# Patient Record
Sex: Male | Born: 1970 | Race: Black or African American | Hispanic: No | Marital: Married | Smoking: Current every day smoker
Health system: Southern US, Community
[De-identification: ages and names within clinical notes are randomized; demographics above are authoritative.]

## PROBLEM LIST (undated history)

## (undated) DIAGNOSIS — I10 Essential (primary) hypertension: Secondary | ICD-10-CM

## (undated) DIAGNOSIS — Z21 Asymptomatic human immunodeficiency virus [HIV] infection status: Secondary | ICD-10-CM

## (undated) DIAGNOSIS — F32A Depression, unspecified: Secondary | ICD-10-CM

## (undated) DIAGNOSIS — R569 Unspecified convulsions: Secondary | ICD-10-CM

## (undated) DIAGNOSIS — F329 Major depressive disorder, single episode, unspecified: Secondary | ICD-10-CM

## (undated) DIAGNOSIS — B20 Human immunodeficiency virus [HIV] disease: Secondary | ICD-10-CM

## (undated) HISTORY — PX: NO PAST SURGERIES: SHX2092

## (undated) HISTORY — PX: OTHER SURGICAL HISTORY: SHX169

---

## 2005-04-19 ENCOUNTER — Emergency Department: Payer: Self-pay | Admitting: Emergency Medicine

## 2005-09-24 ENCOUNTER — Emergency Department: Payer: Self-pay | Admitting: Unknown Physician Specialty

## 2006-07-05 ENCOUNTER — Emergency Department: Payer: Self-pay

## 2007-02-26 ENCOUNTER — Emergency Department: Payer: Self-pay | Admitting: Emergency Medicine

## 2010-07-06 ENCOUNTER — Inpatient Hospital Stay: Payer: Self-pay | Admitting: Psychiatry

## 2010-07-07 ENCOUNTER — Emergency Department: Payer: Self-pay | Admitting: Internal Medicine

## 2015-05-24 ENCOUNTER — Emergency Department
Admission: EM | Admit: 2015-05-24 | Discharge: 2015-05-24 | Disposition: A | Discharge status: Home / Self Care | Payer: Self-pay | Attending: Emergency Medicine | Admitting: Emergency Medicine

## 2015-05-24 ENCOUNTER — Emergency Department: Payer: Self-pay

## 2015-05-24 DIAGNOSIS — Z79899 Other long term (current) drug therapy: Secondary | ICD-10-CM | POA: Insufficient documentation

## 2015-05-24 DIAGNOSIS — R002 Palpitations: Secondary | ICD-10-CM | POA: Insufficient documentation

## 2015-05-24 DIAGNOSIS — F17210 Nicotine dependence, cigarettes, uncomplicated: Secondary | ICD-10-CM | POA: Insufficient documentation

## 2015-05-24 HISTORY — DX: Human immunodeficiency virus (HIV) disease: B20

## 2015-05-24 HISTORY — DX: Unspecified convulsions: R56.9

## 2015-05-24 HISTORY — DX: Asymptomatic human immunodeficiency virus (hiv) infection status: Z21

## 2015-05-24 LAB — COMPREHENSIVE METABOLIC PANEL
ALBUMIN: 4.3 g/dL (ref 3.5–5.0)
ALT: 41 U/L (ref 17–63)
AST: 33 U/L (ref 15–41)
Alkaline Phosphatase: 68 U/L (ref 38–126)
Anion gap: 8 (ref 5–15)
BILIRUBIN TOTAL: 0.6 mg/dL (ref 0.3–1.2)
BUN: 12 mg/dL (ref 6–20)
CHLORIDE: 108 mmol/L (ref 101–111)
CO2: 23 mmol/L (ref 22–32)
CREATININE: 1.1 mg/dL (ref 0.61–1.24)
Calcium: 9.4 mg/dL (ref 8.9–10.3)
GFR calc non Af Amer: >60 mL/min (ref >60)
GLUCOSE: 93 mg/dL (ref 65–99)
Potassium: 3.3 mmol/L — ABNORMAL LOW (ref 3.5–5.1)
Sodium: 139 mmol/L (ref 135–145)
Total Protein: 8.7 g/dL — ABNORMAL HIGH (ref 6.5–8.1)

## 2015-05-24 LAB — BRAIN NATRIURETIC PEPTIDE: B NATRIURETIC PEPTIDE 5: 14 pg/mL (ref 0.0–100.0)

## 2015-05-24 LAB — CBC
HEMATOCRIT: 49.1 % (ref 40.0–52.0)
HEMOGLOBIN: 15.9 g/dL (ref 13.0–18.0)
MCH: 22.7 pg — ABNORMAL LOW (ref 26.0–34.0)
MCHC: 32.3 g/dL (ref 32.0–36.0)
MCV: 70.2 fL — ABNORMAL LOW (ref 80.0–100.0)
Platelets: 214 10*3/uL (ref 150–440)
RBC: 7 MIL/uL — AB (ref 4.40–5.90)
RDW: 15.5 % — AB (ref 11.5–14.5)
WBC: 5.4 10*3/uL (ref 3.8–10.6)

## 2015-05-24 LAB — TROPONIN I: Troponin I: <0.03 ng/mL (ref <0.031)

## 2015-05-24 MED ORDER — SODIUM CHLORIDE 0.9 % IV SOLN
Freq: Once | INTRAVENOUS | Status: AC
  Administered 2015-05-24: 15:00:00 via INTRAVENOUS

## 2015-05-24 MED ORDER — METOPROLOL SUCCINATE ER 50 MG PO TB24
25.0000 mg | ORAL_TABLET | Freq: Every day | ORAL | Status: DC
  Administered 2015-05-24: 25 mg via ORAL
  Filled 2015-05-24: qty 1

## 2015-05-24 MED ORDER — METOPROLOL SUCCINATE ER 25 MG PO TB24: 25.0000 mg | ORAL_TABLET | Freq: Every day | ORAL | Status: DC

## 2015-05-24 NOTE — ED Notes (Signed)
MD Williams at bedside.  

## 2015-05-24 NOTE — ED Notes (Signed)
X-ray at bedside

## 2015-05-24 NOTE — Discharge Instructions (Signed)

## 2015-05-24 NOTE — ED Provider Notes (Signed)
[facility] Emergency Department Provider Note        Time seen: ----------------------------------------- 2:32 PM on 05/24/2015 -----------------------------------------    I have reviewed the triage vital signs and the nursing notes.   HISTORY  Chief Complaint Palpitations    HPI Victor Scott is a 45 y.o. male who presents the ER for racing heartbeat. Patient states he was at [employer] when symptoms occurred. He walked to a friend's house and called 911. Patient states she's never had this type of event before. Denies fevers, chills, chest pain, shortness of breath or other complaints. Patient states he smokes but denies alcohol or drug use. He currently is HIV positive and does take his medications as prescribed. He denies recent changes in his medicines.   Past Medical History  Diagnosis Date  . HIV (human immunodeficiency virus infection) (HCC)   . Seizures (HCC)     There are no active problems to display for this patient.   History reviewed. No pertinent past surgical history.  Allergies Review of patient's allergies indicates no known allergies.  Social History Social History  Substance Use Topics  . Smoking status: Current Every Day Smoker    Types: Cigarettes  . Smokeless tobacco: None  . Alcohol Use: No    Review of Systems Constitutional: Negative for fever. Eyes: Negative for visual changes. ENT: Negative for sore throat. Cardiovascular: Negative for chest pain.Positive for palpitations Respiratory: Negative for shortness of breath. Gastrointestinal: Negative for abdominal pain, vomiting and diarrhea. Genitourinary: Negative for dysuria. Musculoskeletal: Negative for back pain. Skin: Negative for rash. Neurological: Negative for headaches, focal weakness or numbness.  10-point ROS otherwise negative.  ____________________________________________   PHYSICAL EXAM:  VITAL SIGNS: ED Triage Vitals  Enc Vitals  Group     BP 05/24/15 1427 151/106 mmHg     Pulse Rate 05/24/15 1427 105     Resp 05/24/15 1427 20     Temp 05/24/15 1427 97.6 F (36.4 C)     Temp Source 05/24/15 1427 Oral     SpO2 05/24/15 1427 96 %     Weight 05/24/15 1427 317 lb (143.79 kg)     Height 05/24/15 1427  (1.88 m)     Head Cir --      Peak Flow --      Pain Score --      Pain Loc --      Pain Edu? --      Excl. in GC? --     Constitutional: Alert and oriented. Mild distress Eyes: Conjunctivae are normal. PERRL. Normal extraocular movements. ENT   Head: Normocephalic and atraumatic.   Nose: No congestion/rhinnorhea.   Mouth/Throat: Mucous membranes are moist.   Neck: No stridor. Cardiovascular: Normal rate, regular rhythm. No murmurs, rubs, or gallops. Respiratory: Normal respiratory effort without tachypnea nor retractions. Breath sounds are clear and equal bilaterally. No wheezes/rales/rhonchi. Gastrointestinal: Soft and nontender. Normal bowel sounds Musculoskeletal: Nontender with normal range of motion in all extremities. No lower extremity tenderness nor edema. Neurologic:  Normal speech and language. No gross focal neurologic deficits are appreciated.  Skin:  Skin is warm, dry and intact. No rash noted. Psychiatric: Mood and affect are normal. Speech and behavior are normal.  ____________________________________________  EKG: Interpreted by me. Sinus rhythm with sinus arrhythmia, rate is 123 bpm, normal PR interval, normal QRS, normal QT interval. Possible transient atrial fibrillation seen on EKG  ____________________________________________  ED COURSE:  Pertinent labs & imaging results that were available during  my care of the patient were reviewed by me and considered in my medical decision making (see chart for details). Patient presents in no acute distress with a narrow complex tachycardia that appears irregular. We will check cardiac labs and  reevaluate. ____________________________________________    LABS (pertinent positives/negatives)  Labs Reviewed  CBC - Abnormal; Notable for the following:    RBC 7.00 (*)    MCV 70.2 (*)    MCH 22.7 (*)    RDW 15.5 (*)    All other components within normal limits  COMPREHENSIVE METABOLIC PANEL - Abnormal; Notable for the following:    Potassium 3.3 (*)    Total Protein 8.7 (*)    All other components within normal limits  TROPONIN I  BRAIN NATRIURETIC PEPTIDE  URINE DRUG SCREEN, QUALITATIVE ([facility] ONLY)  ETHANOL    RADIOLOGY  Chest x-ray IMPRESSION: No active disease. ____________________________________________  FINAL ASSESSMENT AND PLAN  Palpitations  Plan: Patient with labs and imaging as dictated above. Patient likely had a brief episode of atrial fibrillation. I will place him on low-dose metoprolol and advise close follow-up with cardiology as an outpatient.   [name], [name], MD   Note: This dictation was prepared with Dragon dictation. Any transcriptional errors that result from this process are unintentional   Emily FilbertJonathan E Catarino Vold, MD 05/24/15 [phone]

## 2015-05-24 NOTE — ED Notes (Signed)
Pt comes into the ED via EMS from friends house, [place] [employer] and had racing heart rate, walked to a friends house and called 911..Marland Kitchen

## 2015-10-19 ENCOUNTER — Encounter: Payer: Self-pay | Admitting: Internal Medicine

## 2015-10-19 ENCOUNTER — Emergency Department: Payer: Self-pay

## 2015-10-19 ENCOUNTER — Observation Stay
Admission: EM | Admit: 2015-10-19 | Discharge: 2015-10-20 | Disposition: A | Discharge status: Home / Self Care | Payer: Self-pay | Attending: Internal Medicine | Admitting: Internal Medicine

## 2015-10-19 DIAGNOSIS — Z21 Asymptomatic human immunodeficiency virus [HIV] infection status: Secondary | ICD-10-CM | POA: Insufficient documentation

## 2015-10-19 DIAGNOSIS — F17210 Nicotine dependence, cigarettes, uncomplicated: Secondary | ICD-10-CM | POA: Insufficient documentation

## 2015-10-19 DIAGNOSIS — J322 Chronic ethmoidal sinusitis: Secondary | ICD-10-CM | POA: Insufficient documentation

## 2015-10-19 DIAGNOSIS — Z9119 Patient's noncompliance with other medical treatment and regimen: Secondary | ICD-10-CM | POA: Insufficient documentation

## 2015-10-19 DIAGNOSIS — G9340 Encephalopathy, unspecified: Principal | ICD-10-CM | POA: Insufficient documentation

## 2015-10-19 DIAGNOSIS — E876 Hypokalemia: Secondary | ICD-10-CM | POA: Insufficient documentation

## 2015-10-19 DIAGNOSIS — I639 Cerebral infarction, unspecified: Secondary | ICD-10-CM

## 2015-10-19 DIAGNOSIS — G40909 Epilepsy, unspecified, not intractable, without status epilepticus: Secondary | ICD-10-CM | POA: Insufficient documentation

## 2015-10-19 LAB — URINALYSIS COMPLETE WITH MICROSCOPIC (ARMC ONLY)
BACTERIA UA: NONE SEEN
BILIRUBIN URINE: NEGATIVE
GLUCOSE, UA: NEGATIVE mg/dL
HGB URINE DIPSTICK: NEGATIVE
KETONES UR: NEGATIVE mg/dL
LEUKOCYTES UA: NEGATIVE
NITRITE: NEGATIVE
PH: 6 (ref 5.0–8.0)
Protein, ur: NEGATIVE mg/dL
Specific Gravity, Urine: 1.015 (ref 1.005–1.030)
Squamous Epithelial / LPF: NONE SEEN

## 2015-10-19 LAB — COMPREHENSIVE METABOLIC PANEL
ALT: 42 U/L (ref 17–63)
AST: 37 U/L (ref 15–41)
Albumin: 4.1 g/dL (ref 3.5–5.0)
Alkaline Phosphatase: 57 U/L (ref 38–126)
Anion gap: 8 (ref 5–15)
BUN: 8 mg/dL (ref 6–20)
CHLORIDE: 108 mmol/L (ref 101–111)
CO2: 22 mmol/L (ref 22–32)
CREATININE: 0.99 mg/dL (ref 0.61–1.24)
Calcium: 9.1 mg/dL (ref 8.9–10.3)
Glucose, Bld: 134 mg/dL — ABNORMAL HIGH (ref 65–99)
POTASSIUM: 3.2 mmol/L — AB (ref 3.5–5.1)
SODIUM: 138 mmol/L (ref 135–145)
Total Bilirubin: 0.8 mg/dL (ref 0.3–1.2)
Total Protein: 8.3 g/dL — ABNORMAL HIGH (ref 6.5–8.1)

## 2015-10-19 LAB — CBC
HEMATOCRIT: 46.3 % (ref 40.0–52.0)
HEMOGLOBIN: 15.3 g/dL (ref 13.0–18.0)
MCH: 23.4 pg — AB (ref 26.0–34.0)
MCHC: 33 g/dL (ref 32.0–36.0)
MCV: 70.9 fL — AB (ref 80.0–100.0)
Platelets: 191 10*3/uL (ref 150–440)
RBC: 6.54 MIL/uL — AB (ref 4.40–5.90)
RDW: 15 % — ABNORMAL HIGH (ref 11.5–14.5)
WBC: 3.7 10*3/uL — AB (ref 3.8–10.6)

## 2015-10-19 LAB — PROTEIN AND GLUCOSE, CSF
GLUCOSE CSF: 58 mg/dL (ref 40–70)
Total  Protein, CSF: 37 mg/dL (ref 15–45)

## 2015-10-19 LAB — CSF CELL COUNT WITH DIFFERENTIAL
EOS CSF: 0 %
EOS CSF: 0 %
LYMPHS CSF: 0 %
LYMPHS CSF: 64 %
Monocyte-Macrophage-Spinal Fluid: 0 %
Monocyte-Macrophage-Spinal Fluid: 4 %
Other Cells, CSF: 0
Other Cells, CSF: 0
RBC Count, CSF: 2257 /mm3 — ABNORMAL HIGH (ref 0–3)
RBC Count, CSF: 29059 /mm3 — ABNORMAL HIGH (ref 0–3)
SEGMENTED NEUTROPHILS-CSF: 0 %
SEGMENTED NEUTROPHILS-CSF: 32 %
TUBE #: 1
TUBE #: 3
WBC CSF: 0 /mm3 (ref 0–5)
WBC, CSF: 41 /mm3 (ref 0–5)

## 2015-10-19 LAB — CARBAMAZEPINE LEVEL, TOTAL: Carbamazepine Lvl: <2 ug/mL — ABNORMAL LOW (ref 4.0–12.0)

## 2015-10-19 LAB — URINE DRUG SCREEN, QUALITATIVE (ARMC ONLY)
AMPHETAMINES, UR SCREEN: NOT DETECTED
BARBITURATES, UR SCREEN: NOT DETECTED
BENZODIAZEPINE, UR SCRN: NOT DETECTED
COCAINE METABOLITE, UR ~~LOC~~: NOT DETECTED
Cannabinoid 50 Ng, Ur ~~LOC~~: POSITIVE — AB
MDMA (Ecstasy)Ur Screen: NOT DETECTED
Methadone Scn, Ur: NOT DETECTED
OPIATE, UR SCREEN: NOT DETECTED
PHENCYCLIDINE (PCP) UR S: NOT DETECTED
Tricyclic, Ur Screen: NOT DETECTED

## 2015-10-19 LAB — CRYPTOCOCCAL ANTIGEN, CSF: CRYPTO AG: NEGATIVE

## 2015-10-19 LAB — ETHANOL: Alcohol, Ethyl (B): <5 mg/dL (ref <5)

## 2015-10-19 MED ORDER — LORAZEPAM 2 MG/ML IJ SOLN
2.0000 mg | Freq: Once | INTRAMUSCULAR | Status: AC
  Administered 2015-10-20: 11:00:00 2 mg via INTRAVENOUS
  Filled 2015-10-19: qty 1

## 2015-10-19 MED ORDER — ENOXAPARIN SODIUM 40 MG/0.4ML ~~LOC~~ SOLN
40.0000 mg | Freq: Two times a day (BID) | SUBCUTANEOUS | Status: DC
  Administered 2015-10-19 – 2015-10-20 (×2): 40 mg via SUBCUTANEOUS
  Filled 2015-10-19 (×2): qty 0.4

## 2015-10-19 MED ORDER — LORAZEPAM 2 MG/ML IJ SOLN
1.0000 mg | Freq: Once | INTRAMUSCULAR | Status: AC
  Administered 2015-10-19: 1 mg via INTRAVENOUS

## 2015-10-19 MED ORDER — SODIUM CHLORIDE 0.9% FLUSH
3.0000 mL | Freq: Two times a day (BID) | INTRAVENOUS | Status: DC
  Administered 2015-10-19 – 2015-10-20 (×3): 3 mL via INTRAVENOUS

## 2015-10-19 MED ORDER — LIDOCAINE-EPINEPHRINE (PF) 1 %-1:200000 IJ SOLN: 10.0000 mL | Freq: Once | INTRAMUSCULAR | Status: DC

## 2015-10-19 MED ORDER — EMTRICITABINE-TENOFOVIR AF 200-25 MG PO TABS
1.0000 | ORAL_TABLET | Freq: Every day | ORAL | Status: DC
  Administered 2015-10-19 – 2015-10-20 (×2): 1 via ORAL
  Filled 2015-10-19 (×2): qty 1

## 2015-10-19 MED ORDER — HYDROCODONE-ACETAMINOPHEN 5-325 MG PO TABS: 1.0000 | ORAL_TABLET | ORAL | Status: DC | PRN

## 2015-10-19 MED ORDER — CARBAMAZEPINE 200 MG PO TABS
800.0000 mg | ORAL_TABLET | Freq: Three times a day (TID) | ORAL | Status: DC
  Administered 2015-10-19 (×2): 800 mg via ORAL
  Filled 2015-10-19 (×3): qty 4

## 2015-10-19 MED ORDER — DARUNAVIR-COBICISTAT 800-150 MG PO TABS
1.0000 | ORAL_TABLET | Freq: Every day | ORAL | Status: DC
  Administered 2015-10-19 – 2015-10-20 (×2): 1 via ORAL
  Filled 2015-10-19 (×2): qty 1

## 2015-10-19 MED ORDER — ACETAMINOPHEN 650 MG RE SUPP: 650.0000 mg | Freq: Four times a day (QID) | RECTAL | Status: DC | PRN

## 2015-10-19 MED ORDER — LORAZEPAM 2 MG/ML IJ SOLN
INTRAMUSCULAR | Status: AC
  Administered 2015-10-19: 1 mg via INTRAVENOUS
  Filled 2015-10-19: qty 1

## 2015-10-19 MED ORDER — DIAZEPAM 5 MG/ML IJ SOLN: 5.0000 mg | Freq: Once | INTRAMUSCULAR | Status: DC

## 2015-10-19 MED ORDER — CARBAMAZEPINE 200 MG PO TABS: 400.0000 mg | ORAL_TABLET | Freq: Two times a day (BID) | ORAL | Status: DC

## 2015-10-19 MED ORDER — SODIUM CHLORIDE 0.9 % IV SOLN: 250.0000 mL | INTRAVENOUS | Status: DC | PRN

## 2015-10-19 MED ORDER — CARBAMAZEPINE 200 MG PO TABS
200.0000 mg | ORAL_TABLET | Freq: Once | ORAL | Status: AC
  Administered 2015-10-19: 200 mg via ORAL
  Filled 2015-10-19: qty 1

## 2015-10-19 MED ORDER — SODIUM CHLORIDE 0.9% FLUSH: 3.0000 mL | INTRAVENOUS | Status: DC | PRN

## 2015-10-19 MED ORDER — POTASSIUM CHLORIDE 20 MEQ PO PACK
40.0000 meq | PACK | ORAL | Status: AC
Start: 2015-10-19 — End: 2015-10-19
  Administered 2015-10-19 (×3): 40 meq via ORAL
  Filled 2015-10-19 (×3): qty 2

## 2015-10-19 MED ORDER — ONDANSETRON HCL 4 MG/2ML IJ SOLN: 4.0000 mg | Freq: Four times a day (QID) | INTRAMUSCULAR | Status: DC | PRN

## 2015-10-19 MED ORDER — ONDANSETRON HCL 4 MG PO TABS: 4.0000 mg | ORAL_TABLET | Freq: Four times a day (QID) | ORAL | Status: DC | PRN

## 2015-10-19 MED ORDER — ACETAMINOPHEN 325 MG PO TABS
650.0000 mg | ORAL_TABLET | Freq: Four times a day (QID) | ORAL | Status: DC | PRN
Start: 2015-10-19 — End: 2015-10-20

## 2015-10-19 MED ORDER — LIDOCAINE-EPINEPHRINE (PF) 2 %-1:200000 IJ SOLN
10.0000 mL | Freq: Once | INTRAMUSCULAR | Status: AC
  Administered 2015-10-19: 10 mL
  Filled 2015-10-19: qty 10

## 2015-10-19 NOTE — Progress Notes (Signed)
MRI staff gone for the day. Patient will have MRI in the morning. Pre-medication, Ativan, rescheduled for 0800am tomorrow.

## 2015-10-19 NOTE — ED Provider Notes (Signed)
[facility] Emergency Department Provider Note  ____________________________________________  Time seen: Approximately 12:06 PM  I have reviewed the triage vital signs and the nursing notes.   HISTORY  Chief Complaint Altered Mental Status  Level 5 caveat:  Portions of the history and physical were unable to be obtained due to AMS   HPI Ronalee BeltsBartholomew Hobson is a 45 y.o. male with a history of HIV and seizure disorder who presents for evaluation of altered mental status. Patient presented to work today and was confused so EMS was called. Patient says that he thinks he had a seizure yesterday evening but is unsure. Is accompanied by his girlfriend who tells me that patient was in his usual state of health Sunday. He visited their grandchildren and watched the movie. Monday morning when he woke up he was confused, very quiet, not speaking very well. He was still walking and doing his daily activities but very different than his baseline. She reports that he slept for most of the night. That evening at 8 PM they both went to work. They work together in the same company. Patient continued to be confused, did not know how to operate machinery and his boss told him to go back home. She took him back home and returned to work. This morning at 8 AM when she was leaving work she called the house and ask him to get dressed to come to the hospital. When she got there patient was getting dressed in the bathroom. He continues to speak very little and not behaving like his normal self. Girlfriend is not aware the patient is HIV positive. She denies any recent illness. She does endorse the patient does not take any medications. They've been together for a year and she has never seen him see a doctor.  Past Medical History:  Diagnosis Date  . HIV (human immunodeficiency virus infection) (HCC)   . Seizures (HCC)     There are no active problems to display for this patient.   No  past surgical history on file.  Prior to Admission medications   Medication Sig Start Date End Date Taking? Authorizing Provider  darunavir-cobicistat (PREZCOBIX) 800-150 MG tablet Take 1 tablet by mouth daily. Swallow whole. Do NOT crush, break or chew tablets. Take with food.    Historical Provider, MD  emtricitabine-tenofovir (TRUVADA) 200-300 MG tablet Take 1 tablet by mouth daily.    Historical Provider, MD  metoprolol succinate (TOPROL XL) 25 MG 24 hr tablet Take 1 tablet (25 mg total) by mouth daily. 05/24/15 05/23/16  Emily FilbertJonathan E Williams, MD    Allergies Review of patient's allergies indicates no known allergies.  No family history on file.  Social History Social History  Substance Use Topics  . Smoking status: Current Every Day Smoker    Types: Cigarettes  . Smokeless tobacco: Not on file  . Alcohol use No    Review of Systems  Constitutional: Negative for fever. Eyes: Negative for visual changes. ENT: Negative for sore throat. Cardiovascular: Negative for chest pain. Respiratory: Negative for shortness of breath. Gastrointestinal: Negative for abdominal pain, vomiting or diarrhea. Genitourinary: Negative for dysuria. Musculoskeletal: Negative for back pain. Skin: Negative for rash. Neurological: Negative for headaches, weakness or numbness.  ____________________________________________   PHYSICAL EXAM:  VITAL SIGNS: ED Triage Vitals  Enc Vitals Group     BP 10/19/15 0935 (!) 166/102     Pulse Rate 10/19/15 0935 70     Resp 10/19/15 0935 16  Temp 10/19/15 0935 97.6 F (36.4 C)     Temp Source 10/19/15 0935 Oral     SpO2 10/19/15 0935 98 %     Weight 10/19/15 0935 (!) 317 lb (143.8 kg)     Height 10/19/15 0935 6\' 2"  (1.88 m)     Head Circumference --      Peak Flow --      Pain Score 10/19/15 0936 0     Pain Loc --      Pain Edu? --      Excl. in GC? --     Constitutional: Alert and oriented x 3, slow to answer questions.  HEENT:      Head:  Normocephalic and atraumatic.         Eyes: Conjunctivae are normal. Sclera is non-icteric. EOMI. PERRL      Mouth/Throat: Mucous membranes are moist.       Neck: Supple with no signs of meningismus. Cardiovascular: Regular rate and rhythm. No murmurs, gallops, or rubs. 2+ symmetrical distal pulses are present in all extremities. No JVD. Respiratory: Normal respiratory effort. Lungs are clear to auscultation bilaterally. No wheezes, crackles, or rhonchi.  Gastrointestinal: Soft, non tender, and non distended with positive bowel sounds. No rebound or guarding. Musculoskeletal: Nontender with normal range of motion in all extremities. No edema, cyanosis, or erythema of extremities. Neurologic: Normal speech and language. Face is symmetric. Moving all extremities. No gross focal neurologic deficits are appreciated. Skin: Skin is warm, dry and intact. No rash noted. Psychiatric: Mood and affect are normal. Speech and behavior are normal.  ____________________________________________   LABS (all labs ordered are listed, but only abnormal results are displayed)  Labs Reviewed  COMPREHENSIVE METABOLIC PANEL - Abnormal; Notable for the following:       Result Value   Potassium 3.2 (*)    Glucose, Bld 134 (*)    Total Protein 8.3 (*)    All other components within normal limits  CBC - Abnormal; Notable for the following:    WBC 3.7 (*)    RBC 6.54 (*)    MCV 70.9 (*)    MCH 23.4 (*)    RDW 15.0 (*)    All other components within normal limits  CARBAMAZEPINE LEVEL, TOTAL - Abnormal; Notable for the following:    Carbamazepine Lvl <2.0 (*)    All other components within normal limits  URINALYSIS COMPLETEWITH MICROSCOPIC ([facility] ONLY) - Abnormal; Notable for the following:    Color, Urine YELLOW (*)    APPearance CLEAR (*)    All other components within normal limits  URINE DRUG SCREEN, QUALITATIVE ([facility] ONLY) - Abnormal; Notable for the following:    Cannabinoid 50 Ng, Ur [place] POSITIVE (*)      All other components within normal limits  CSF CULTURE  GRAM STAIN  ETHANOL  PROTEIN AND GLUCOSE, CSF  CRYPTOCOCCAL ANTIGEN, CSF  CSF CELL COUNT WITH DIFFERENTIAL  CSF CELL COUNT WITH DIFFERENTIAL  HERPES SIMPLEX VIRUS(HSV) DNA BY PCR   ____________________________________________  EKG  ED ECG REPORT I, Nita Sickle, the attending physician, personally viewed and interpreted this ECG.  Normal sinus rhythm, rate of 71, right bundle branch block, normal QTC, left axis deviation, no ST elevations, T-wave inversion in lead 3. No significant changes from prior ____________________________________________  RADIOLOGY  Head CT: negative  ____________________________________________   PROCEDURES  Procedure(s) performed: None .Lumbar Puncture Date/Time: 10/19/2015 2:29 PM Performed by: Nita Sickle Authorized by: Nita Sickle   Consent:    Consent  obtained:  Written   Consent given by:  Patient   Risks discussed:  Bleeding, infection, pain, repeat procedure, nerve damage and headache Pre-procedure details:    Procedure purpose:  Diagnostic Sedation:    Sedation type:  Anxiolysis Anesthesia (see MAR for exact dosages):    Anesthesia method:  Local infiltration   Local anesthetic:  Lidocaine 2% WITH epi Procedure details:    Lumbar space:  L4-L5 interspace   Patient position:  L lateral decubitus   Needle gauge:  20   Needle type:  Spinal needle - Quincke tip   Ultrasound guidance: no     Number of attempts:  2   Opening pressure (cm H2O):  21   Fluid appearance:  Blood-tinged   Tubes of fluid:  4 Post-procedure:    Puncture site:  Adhesive bandage applied   Patient tolerance of procedure:  Tolerated well, no immediate complications   Critical Care performed:  None ____________________________________________   INITIAL IMPRESSION / ASSESSMENT AND PLAN / ED COURSE  45 y.o. male with a history of HIV and seizure disorder who presents for  evaluation of altered mental status and questionable seizure last night. Patient seems still a bit confused but is awake and alert x 3. Reports noncompliance with his medications for months. Patient has no complaints at this time. He is neurologically intact other than mild confusion when answering questions. His Tegretol level here is less than 2.0. Remaining of his labs are WNL. Will add ethanol level and drug screen and consult neurology. Since patient seems to be altered from > 24 hours with no witnessed seizures and is HIV positive we'll pursue an LP at this time. Discussed patient with Dr. Thad Ranger who recommended starting patient on Tegretol 200 mg as we do not know his dosage.   Clinical Course  Comment By Time  Patient 's mental status continues to wax and wane. CSF studies pending. Plan to admit for MRI, EEG, and formal neurology consultation. Nita Sickle, MD 10/10 1524    Pertinent labs & imaging results that were available during my care of the patient were reviewed by me and considered in my medical decision making (see chart for details).    ____________________________________________   FINAL CLINICAL IMPRESSION(S) / ED DIAGNOSES  Final diagnoses:  Encephalopathy acute      NEW MEDICATIONS STARTED DURING THIS VISIT:  New Prescriptions   No medications on file     Note:  This document was prepared using Dragon voice recognition software and may include unintentional dictation errors.    Nita Sickle, MD 10/19/15 [phone]

## 2015-10-19 NOTE — ED Triage Notes (Signed)
Patient presents to ED with AMS. Patient reports history of seizures, and may have had one yesterday but it was not witnessed. Patient is alert but not oriented to time or situation.

## 2015-10-19 NOTE — Progress Notes (Signed)
Anticoagulation monitoring(Lovenox):  45 yo  male ordered Lovenox 40 mg Q24h  Filed Weights   10/19/15 0935  Weight: (!) 317 lb (143.8 kg)   BMI 40.3   Lab Results  Component Value Date   CREATININE 0.99 10/19/2015   CREATININE 1.10 05/24/2015   Estimated Creatinine Clearance: 143.8 mL/min (by C-G formula based on SCr of 0.99 mg/dL). Hemoglobin & Hematocrit     Component Value Date/Time   HGB 15.3 10/19/2015 0943   HCT 46.3 10/19/2015 0943     Per Protocol for Patient with estCrcl > 30 ml/min and BMI > 40, will transition to Lovenox 40 mg Q12h.

## 2015-10-19 NOTE — ED Notes (Signed)
MD at bedside for LP.

## 2015-10-19 NOTE — H&P (Signed)
Completed earlier

## 2015-10-19 NOTE — ED Notes (Signed)
Mental status waxing and waning. Two witnesses and MD present for LP consent. Pt verbalized understanding of procedure.

## 2015-10-19 NOTE — ED Notes (Signed)
CSF samples walked to lab by mike emp

## 2015-10-19 NOTE — Consult Note (Signed)
[facility] Infectious Disease     Reason for Consult: AMS, HIV    Referring Physician: Serita Grit Date of Admission:  10/19/2015   Active Problems:   * No active hospital problems. *   HPI: Victor Scott is a 45 y.o. male admitted with AMS for 1-2 days. He apparently was in usual state of health and working but potentially had a seizure and then was intermittently confused. He is unable to provide much history reliably but hx obtained from ED physician and notes. He has had a ct head and LP. He has HIV, not on meds for about a year, last cd 4 400 in Jan 2017, long hx of noncompliance. 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   Past Medical History:  Diagnosis Date  . HIV (human immunodeficiency virus infection) ([place])   . Seizures ([place])    No past surgical history on file. Social History  Substance Use Topics  . Smoking status: Current Every Day Smoker    Types: Cigarettes  . Smokeless tobacco: Not on file  . Alcohol use No   No family history on file.  Allergies: No Known Allergies  Current antibiotics: Antibiotics Given (last 72 hours)    None      MEDICATIONS: . lidocaine-EPINEPHrine  10 mL Other Once    Review of Systems - 11 systems reviewed and negative per HPI   OBJECTIVE: Temp:  [97.6 F (36.4 C)] 97.6 F (36.4 C) (10/10 0935) Pulse Rate:  [58-70] 59 (10/10 1330) Resp:  [16-20] 20 (10/10 1330) BP: (140-166)/(89-102) 145/98 (10/10 1330) SpO2:  [94 %-98 %] 97 % (10/10 1330) Weight:  [143.8 kg (317 lb)] 143.8 kg (317 lb) (10/10 0935) Physical Exam  Constitutional: He is awake but confused,  HENT: anicteric, perrla Mouth/Throat: Oropharynx is clear and moist. No oropharyngeal exudate.  Cardiovascular: Normal rate, regular rhythm and normal heart sounds.  Pulmonary/Chest: Effort normal and breath sounds normal. No respiratory distress. He has no wheezes.  Abdominal: Soft. Bowel sounds are normal. He exhibits no distension. There is no tenderness.   Lymphadenopathy: He has no cervical adenopathy.  Neurological: He is confused, when asked where he is he says "crazy". Can tell me the year though. Psychiatric: altered    LABS: Results for orders placed or performed during the hospital encounter of 10/19/15 (from the past 48 hour(s))  Comprehensive metabolic panel     Status: Abnormal   Collection Time: 10/19/15  9:43 AM  Result Value Ref Range   Sodium 138 135 - 145 mmol/L   Potassium 3.2 (L) 3.5 - 5.1 mmol/L   Chloride 108 101 - 111 mmol/L   CO2 22 22 - 32 mmol/L   Glucose, Bld 134 (H) 65 - 99 mg/dL   BUN 8 6 - 20 mg/dL   Creatinine, Ser 0.99 0.61 - 1.24 mg/dL   Calcium 9.1 8.9 - 10.3 mg/dL   Total Protein 8.3 (H) 6.5 - 8.1 g/dL   Albumin 4.1 3.5 - 5.0 g/dL   AST 37 15 - 41 U/L   ALT 42 17 - 63 U/L   Alkaline Phosphatase 57 38 - 126 U/L   Total Bilirubin 0.8 0.3 - 1.2 mg/dL   GFR calc non Af Amer >60 >60 mL/min   GFR calc Af Amer >60 >60 mL/min    Comment: (NOTE) The eGFR has been calculated using the CKD EPI equation. This calculation has not been validated in all clinical situations. eGFR's persistently <60 mL/min signify possible Chronic Kidney Disease.  Anion gap 8 5 - 15  CBC     Status: Abnormal   Collection Time: 10/19/15  9:43 AM  Result Value Ref Range   WBC 3.7 (L) 3.8 - 10.6 K/uL   RBC 6.54 (H) 4.40 - 5.90 MIL/uL   Hemoglobin 15.3 13.0 - 18.0 g/dL   HCT 46.3 40.0 - 52.0 %   MCV 70.9 (L) 80.0 - 100.0 fL   MCH 23.4 (L) 26.0 - 34.0 pg   MCHC 33.0 32.0 - 36.0 g/dL   RDW 15.0 (H) 11.5 - 14.5 %   Platelets 191 150 - 440 K/uL  Urinalysis complete, with microscopic ([facility] only)     Status: Abnormal   Collection Time: 10/19/15  9:43 AM  Result Value Ref Range   Color, Urine YELLOW (A) YELLOW   APPearance CLEAR (A) CLEAR   Glucose, UA NEGATIVE NEGATIVE mg/dL   Bilirubin Urine NEGATIVE NEGATIVE   Ketones, ur NEGATIVE NEGATIVE mg/dL   Specific Gravity, Urine 1.015 1.005 - 1.030   Hgb urine dipstick NEGATIVE  NEGATIVE   pH 6.0 5.0 - 8.0   Protein, ur NEGATIVE NEGATIVE mg/dL   Nitrite NEGATIVE NEGATIVE   Leukocytes, UA NEGATIVE NEGATIVE   RBC / HPF 0-5 0 - 5 RBC/hpf   WBC, UA 0-5 0 - 5 WBC/hpf   Bacteria, UA NONE SEEN NONE SEEN   Squamous Epithelial / LPF NONE SEEN NONE SEEN   Mucous PRESENT   Urine Drug Screen, Qualitative ([facility] only)     Status: Abnormal   Collection Time: 10/19/15  9:43 AM  Result Value Ref Range   Tricyclic, Ur Screen NONE DETECTED NONE DETECTED   Amphetamines, Ur Screen NONE DETECTED NONE DETECTED   MDMA (Ecstasy)Ur Screen NONE DETECTED NONE DETECTED   Cocaine Metabolite,Ur [place] NONE DETECTED NONE DETECTED   Opiate, Ur Screen NONE DETECTED NONE DETECTED   Phencyclidine (PCP) Ur S NONE DETECTED NONE DETECTED   Cannabinoid 50 Ng, Ur [place] POSITIVE (A) NONE DETECTED   Barbiturates, Ur Screen NONE DETECTED NONE DETECTED   Benzodiazepine, Ur Scrn NONE DETECTED NONE DETECTED   Methadone Scn, Ur NONE DETECTED NONE DETECTED    Comment: (NOTE) 786  Tricyclics, urine               Cutoff 1000 ng/mL 200  Amphetamines, urine             Cutoff 1000 ng/mL 300  MDMA (Ecstasy), urine           Cutoff 500 ng/mL 400  Cocaine Metabolite, urine       Cutoff 300 ng/mL 500  Opiate, urine                   Cutoff 300 ng/mL 600  Phencyclidine (PCP), urine      Cutoff 25 ng/mL 700  Cannabinoid, urine              Cutoff 50 ng/mL 800  Barbiturates, urine             Cutoff 200 ng/mL 900  Benzodiazepine, urine           Cutoff 200 ng/mL 1000 Methadone, urine                Cutoff 300 ng/mL 1100 1200 The urine drug screen provides only a preliminary, unconfirmed 1300 analytical test result and should not be used for non-medical 1400 purposes. Clinical consideration and professional judgment should 1500 be applied to any positive drug screen result due to possible  1600 interfering substances. A more specific alternate chemical method 1700 must be used in order to obtain a confirmed analytical  result.  1800 Gas chromato graphy / mass spectrometry (GC/MS) is the preferred 1900 confirmatory method.   Carbamazepine level, total     Status: Abnormal   Collection Time: 10/19/15  9:45 AM  Result Value Ref Range   Carbamazepine Lvl <2.0 (L) 4.0 - 12.0 ug/mL  Ethanol     Status: None   Collection Time: 10/19/15 12:30 PM  Result Value Ref Range   Alcohol, Ethyl (B) <5 <5 mg/dL    Comment:        LOWEST DETECTABLE LIMIT FOR SERUM ALCOHOL IS 5 mg/dL FOR MEDICAL PURPOSES ONLY   CSF culture     Status: None (Preliminary result)   Collection Time: 10/19/15  2:20 PM  Result Value Ref Range   Specimen Description LP    Special Requests NONE    Gram Stain NO WBC SEEN NO RBC SEEN NO ORGANISMS SEEN     Culture PENDING    Report Status PENDING   Protein and glucose, CSF     Status: None   Collection Time: 10/19/15  2:20 PM  Result Value Ref Range   Glucose, CSF 58 40 - 70 mg/dL   Total  Protein, CSF 37 15 - 45 mg/dL  Cryptococcal antigen, CSF ([facility] only)     Status: None   Collection Time: 10/19/15  2:20 PM  Result Value Ref Range   Crypto Ag NEGATIVE NEGATIVE   Cryptococcal Ag Titer NOT INDICATED NOT INDICATED   No components found for: ESR, C REACTIVE PROTEIN MICRO: Recent Results (from the past 720 hour(s))  CSF culture     Status: None (Preliminary result)   Collection Time: 10/19/15  2:20 PM  Result Value Ref Range Status   Specimen Description LP  Final   Special Requests NONE  Final   Gram Stain NO WBC SEEN NO RBC SEEN NO ORGANISMS SEEN   Final   Culture PENDING  Incomplete   Report Status PENDING  Incomplete   Genosure prime 03/2015 Patient: Victor Scott ID: [id number] Note: All result statuses are Final unless otherwise noted.  Tests: (1) RNA, PCR(Graph)rfx/GenoPRI ([id number])   Order Note: Clinical Information: 2 plasmas /edta-frozen  ! HIV-1 RNA by PCR          236000 copies/mL     The reportable range for this assay is 20 to 10,000,000      copies HIV-1 RNA/mL.      ! log10 HIV-1 RNA           5.373 log10copy/mL ! HIV GenoSure PRIme(SM)                             To be performe...     To be performed on this specimen.       Tests: (2) Reflex to GenoSure PRIme(R) [phone]) ! HIV GenoSure PRIme(R)                             Pol gene ampli...                            *1     Pol gene amplicon adequate for sequencing      ! HIV GenoSure PRIme(R)  The HIV-1 Geno...                            *2     The HIV-1 GenoSure PRIme(SM) for this specimen has been completed.     GenoSure PRIme(SM)     HIV-1 Subtype: B     Drug                         Genotypic     Generic Name    Brand Name   Assessment           Comments     NRTI     Abacavir        Ziagen       Sensitive        RAMs*: None     Didanosine      Videx        Sensitive        RAMs*: None     Emtricitabine   Emtriva      Sensitive        RAMs*: None     Lamivudine      Epivir       Sensitive        RAMs*: None     Stavudine       Zerit        Sensitive        RAMs*: None     Tenofovir       Viread       Sensitive        RAMs*: None     Zidovudine      Retrovir     Sensitive        RAMs*: None     NNRTI     Efavirenz       Sustiva      Sensitive        RAMs*: None     Etravirine      Intelence    Sensitive        RAMs*: None     Nevirapine      Viramune     Sensitive        RAMs*: None     Rilpivirine     Edurant      Sensitive        RAMs*: None     INI     Dolutegravir    Tivicay      Sensitive        RAMs*: None     Elvitegravir    Vitekta      Sensitive        RAMs*: None     Raltegravir     Isentress    Sensitive        RAMs*: None     PI     Atazanavir      Reyataz      Sensitive        RAMs*: None     Atazanavir/r    Reyataz / r  Sensitive        RAMs*: None     Darunavir/r     Prezista / r Sensitive        RAMs*: None     Fosamprenavir/r Lexiva / r   Sensitive        RAMs*: None     Indinavir/r     Crixivan /  r Sensitive  RAMs*: None     Lopinavir       Kaletra      Sensitive        RAMs*: None     Nelfinavir      Viracept     Sensitive        RAMs*: None     Ritonavir       Norvir       Sensitive        RAMs*: None     Saquinavir/r    Invirase / r Sensitive        RAMs*: None     Tipranavir/r    Aptivus / r  Sensitive        RAMs*: None     Summary of Mutations Observed:     RT: Q102K, K122E, I178M, V245V/M, A272P, R277R/K, T286A,         V293I     IN: S17N, M50I, V72V/I, T124A, T125A, K156N, V234L     PR: N37S, R41R/K, R57K, L63P, I64L     Assessment of drug susceptibility is based upon detected     mutations and interpreted using an advanced proprietary     algorithm (version 16).      ! HIV GenoSure PRIme(R) Interp                             Interpretation...        IMAGING: Ct Head Wo Contrast  Result Date: 10/19/2015 CLINICAL DATA:  45 year old male with recent history of altered mental status. History of seizures, with last witnessed seizure yesterday. EXAM: CT HEAD WITHOUT CONTRAST TECHNIQUE: Contiguous axial images were obtained from the base of the skull through the vertex without intravenous contrast. COMPARISON:  No priors. FINDINGS: Brain: No evidence of acute infarction, hemorrhage, hydrocephalus, extra-axial collection or mass lesion/mass effect. Vascular: No hyperdense vessel or unexpected calcification. Skull: Normal. Negative for fracture or focal lesion. Sinuses/Orbits: No acute finding. Other: None. IMPRESSION: 1. No acute intracranial abnormalities. 2. The appearance of the brain is normal. Electronically Signed   By: Vinnie Langton M.D.   On: 10/19/2015 10:11    Assessment:   Victor Scott is a 45 y.o. male with HIV, polsyubstance abuse, sz do admitted with AMS, and possible seizures.   Last cd4 428 in Jan 2017 with elevated VL and long history of noncompliance.   LP not very impressive, crypto neg.  Recommendations I suspect sz related. Unlikely  that CD4 has dropped < 200 in the time since last seen.  Doubt PML given sudden onset. Crypto neg.  Will check CD4,  Agree with neuro eval, MRI and EEG   Thank you very much for allowing me to participate in the care of this patient. Please call with questions.   Cheral Marker. Ola Spurr, MD

## 2015-10-19 NOTE — ED Notes (Signed)
Pt had suspected seizure last night but no witness. Was sent home from work because not acting right per girlfriend. When asked pt if he got sent home from work he did not think he had a job but works for a [employer] In [name]. Disoriented to time and situation. Last seizure "long" time ago. Pulses +2 bilateral radial. PERRL. Has been taking tegretol per pt without missed doses. Girlfriend reports he is not acting right.

## 2015-10-19 NOTE — H&P (Signed)
[facility]  [facility]   PATIENT NAME: Victor Scott Vantol    MR#:  [id number]  DATE OF BIRTH:  08/24/1970  DATE OF ADMISSION:  10/19/2015  PRIMARY CARE PHYSICIAN: Lanier EnsignSOLES, MEREDITH KEY, MD   REQUESTING/REFERRING PHYSICIAN: Cecil Cobbsaroline Veronese CHIEF COMPLAINT:   Chief Complaint  Patient presents with  . Altered Mental Status    HISTORY OF PRESENT ILLNESS: Victor Scott Swing  is a 45 y.o. male with a known history of HIV and seizures Was brought in for evaluation of altered mental status. Patient thinks that he may have had a seizure yesterday evening but is not sure. He is supposed to be on seizure medication but his Tegretol level is low. His girlfriend who is with him states that he was normal on Sunday. When he woke up on Monday morning he was confused very quiet and not speaking very well. He was still able to ambulate and doing his daily activities but was different from his baseline. 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 And was told by his boss to go back home. He was brought to the ED and underwent LP. CT scan of the head is normal. Currently patient able to answer questions. States that he feels back to baseline.   PAST MEDICAL HISTORY:   Past Medical History:  Diagnosis Date  . HIV (human immunodeficiency virus infection) ([facility])   . Seizures ([facility])     PAST SURGICAL HISTORY:  Past Surgical History:  Procedure Laterality Date  . none      SOCIAL HISTORY:  Social History  Substance Use Topics  . Smoking status: Current Every Day Smoker    Types: Cigarettes  . Smokeless tobacco: Not on file  . Alcohol use No    FAMILY HISTORY:  Family History  Problem Relation Age of Onset  . Hypertension Father     DRUG ALLERGIES: No Known Allergies  REVIEW OF SYSTEMS:   CONSTITUTIONAL: No fever, fatigue or weakness.  EYES: No blurred or double vision.  EARS, NOSE, AND THROAT: No tinnitus or ear pain.   RESPIRATORY: No cough, shortness of breath, wheezing or hemoptysis.  CARDIOVASCULAR: No chest pain, orthopnea, edema.  GASTROINTESTINAL: No nausea, vomiting, diarrhea or abdominal pain.  GENITOURINARY: No dysuria, hematuria.  ENDOCRINE: No polyuria, nocturia,  HEMATOLOGY: No anemia, easy bruising or bleeding SKIN: No rash or lesion. MUSCULOSKELETAL: No joint pain or arthritis.   NEUROLOGIC: Change in mental status as per girlfriend PSYCHIATRY: No anxiety or depression.   MEDICATIONS AT HOME:  Prior to Admission medications   Medication Sig Start Date End Date Taking? Authorizing Provider  carbamazepine (TEGRETOL) 200 MG tablet Take 400 mg by mouth 2 (two) times daily.   Yes Historical Provider, MD  darunavir-cobicistat (PREZCOBIX) 800-150 MG tablet Take 1 tablet by mouth daily. Swallow whole. Do NOT crush, break or chew tablets. Take with food.   Yes Historical Provider, MD  emtricitabine-tenofovir (TRUVADA) 200-300 MG tablet Take 1 tablet by mouth daily.   Yes Historical Provider, MD  metoprolol succinate (TOPROL XL) 25 MG 24 hr tablet Take 1 tablet (25 mg total) by mouth daily. 05/24/15 05/23/16  Emily FilbertJonathan E Williams, MD      PHYSICAL EXAMINATION:   VITAL SIGNS: Blood pressure (!) 151/97, pulse (!) 59, temperature 97.6 F (36.4 C), temperature source Oral, resp. rate 20, height 6\' 2"  (1.88 m), weight (!) 317 lb (143.8 kg), SpO2 97 %.  GENERAL:  45 y.o.-year-old patient lying in the bed  with no acute distress.  EYES: Pupils equal, round, reactive to light and accommodation. No scleral icterus. Extraocular muscles intact.  HEENT: Head atraumatic, normocephalic. Oropharynx and nasopharynx clear.  NECK:  Supple, no jugular venous distention. No thyroid enlargement, no tenderness.  LUNGS: Normal breath sounds bilaterally, no wheezing, rales,rhonchi or crepitation. No use of accessory muscles of respiration.  CARDIOVASCULAR: S1, S2 normal. No murmurs, rubs, or gallops.  ABDOMEN: Soft,  nontender, nondistended. Bowel sounds present. No organomegaly or mass.  EXTREMITIES: No pedal edema, cyanosis, or clubbing.  NEUROLOGIC: Cranial nerves II through XII are intact. Muscle strength 5/5 in all extremities. Sensation intact. Gait not checked.  PSYCHIATRIC: The patient is alert and oriented x 3.  SKIN: No obvious rash, lesion, or ulcer.   LABORATORY PANEL:   CBC  Recent Labs Lab 10/19/15 0943  WBC 3.7*  HGB 15.3  HCT 46.3  PLT 191  MCV 70.9*  MCH 23.4*  MCHC 33.0  RDW 15.0*   ------------------------------------------------------------------------------------------------------------------  Chemistries   Recent Labs Lab 10/19/15 0943  NA 138  K 3.2*  CL 108  CO2 22  GLUCOSE 134*  BUN 8  CREATININE 0.99  CALCIUM 9.1  AST 37  ALT 42  ALKPHOS 57  BILITOT 0.8   ------------------------------------------------------------------------------------------------------------------ estimated creatinine clearance is 143.8 mL/min (by C-G formula based on SCr of 0.99 mg/dL). ------------------------------------------------------------------------------------------------------------------ No results for input(s): TSH, T4TOTAL, T3FREE, THYROIDAB in the last 72 hours.  Invalid input(s): FREET3   Coagulation profile No results for input(s): INR, PROTIME in the last 168 hours. ------------------------------------------------------------------------------------------------------------------- No results for input(s): DDIMER in the last 72 hours. -------------------------------------------------------------------------------------------------------------------  Cardiac Enzymes No results for input(s): CKMB, TROPONINI, MYOGLOBIN in the last 168 hours.  Invalid input(s): CK ------------------------------------------------------------------------------------------------------------------ Invalid input(s):  POCBNP  ---------------------------------------------------------------------------------------------------------------  Urinalysis    Component Value Date/Time   COLORURINE YELLOW (A) 10/19/2015 0943   APPEARANCEUR CLEAR (A) 10/19/2015 0943   LABSPEC 1.015 10/19/2015 0943   PHURINE 6.0 10/19/2015 0943   GLUCOSEU NEGATIVE 10/19/2015 0943   HGBUR NEGATIVE 10/19/2015 0943   BILIRUBINUR NEGATIVE 10/19/2015 0943   KETONESUR NEGATIVE 10/19/2015 0943   PROTEINUR NEGATIVE 10/19/2015 0943   NITRITE NEGATIVE 10/19/2015 0943   LEUKOCYTESUR NEGATIVE 10/19/2015 0943     RADIOLOGY: Ct Head Wo Contrast  Result Date: 10/19/2015 CLINICAL DATA:  45 year old male with recent history of altered mental status. History of seizures, with last witnessed seizure yesterday. EXAM: CT HEAD WITHOUT CONTRAST TECHNIQUE: Contiguous axial images were obtained from the base of the skull through the vertex without intravenous contrast. COMPARISON:  No priors. FINDINGS: Brain: No evidence of acute infarction, hemorrhage, hydrocephalus, extra-axial collection or mass lesion/mass effect. Vascular: No hyperdense vessel or unexpected calcification. Skull: Normal. Negative for fracture or focal lesion. Sinuses/Orbits: No acute finding. Other: None. IMPRESSION: 1. No acute intracranial abnormalities. 2. The appearance of the brain is normal. Electronically Signed   By: Trudie Reed M.D.   On: 10/19/2015 10:11    EKG: Orders placed or performed during the hospital encounter of 10/19/15  . EKG 12-Lead  . EKG 12-Lead  . ED EKG  . ED EKG    IMPRESSION AND PLAN: Patient is a 45 year old African-American male being admitted for changes in his mental status compared to baseline  1. Acute encephalopathy Could be related to seizure in a post ictal related a state with him being slow At this time I will restart his Tegretol at a higher dose aspirin neurology to see I will also ask infectious disease to see  have discussed  with Dr. Lurene Shadow he does not feel that this is meningitis Will obtain MRI of the brain to rule out encephalitis  2. HIV Patient apparently stopped taking medications and following up with Dr. Lurene Shadow I will resume his HIV medications  3. Hypokalemia Replace his potassium  4 miscellaneous Lovenox for DVT prophylaxis.    All the records are reviewed and case discussed with ED provider. Management plans discussed with the patient, family and they are in agreement.  CODE STATUS:    Code Status Orders        Start     Ordered   10/19/15 1540  Full code  Continuous     10/19/15 1540    Code Status History    Date Active Date Inactive Code Status Order ID Comments User Context   This patient has a current code status but no historical code status.       TOTAL TIME TAKING CARE OF THIS PATIENT: 55 minutes.    Auburn Bilberry M.D on 10/19/2015 at 3:55 PM  Between 7am to 6pm - Pager - [phone]  After 6pm go to [email or web address] - password [email or web address] [facility]  [facility]  Office  [phone]  CC: Primary care physician; Lanier Ensign KEY, MD

## 2015-10-20 ENCOUNTER — Observation Stay: Payer: Self-pay

## 2015-10-20 DIAGNOSIS — G9340 Encephalopathy, unspecified: Principal | ICD-10-CM

## 2015-10-20 DIAGNOSIS — R569 Unspecified convulsions: Secondary | ICD-10-CM

## 2015-10-20 LAB — BASIC METABOLIC PANEL
Anion gap: 7 (ref 5–15)
BUN: 10 mg/dL (ref 6–20)
CO2: 24 mmol/L (ref 22–32)
CREATININE: 0.91 mg/dL (ref 0.61–1.24)
Calcium: 9 mg/dL (ref 8.9–10.3)
Chloride: 108 mmol/L (ref 101–111)
GFR calc Af Amer: >60 mL/min (ref >60)
Glucose, Bld: 129 mg/dL — ABNORMAL HIGH (ref 65–99)
POTASSIUM: 3.9 mmol/L (ref 3.5–5.1)
SODIUM: 139 mmol/L (ref 135–145)

## 2015-10-20 LAB — CBC
HCT: 46.2 % (ref 40.0–52.0)
Hemoglobin: 15.2 g/dL (ref 13.0–18.0)
MCH: 23.5 pg — AB (ref 26.0–34.0)
MCHC: 33 g/dL (ref 32.0–36.0)
MCV: 71.4 fL — ABNORMAL LOW (ref 80.0–100.0)
PLATELETS: 175 10*3/uL (ref 150–440)
RBC: 6.47 MIL/uL — AB (ref 4.40–5.90)
RDW: 15.5 % — ABNORMAL HIGH (ref 11.5–14.5)
WBC: 3.4 10*3/uL — ABNORMAL LOW (ref 3.8–10.6)

## 2015-10-20 LAB — PATHOLOGIST SMEAR REVIEW

## 2015-10-20 LAB — HERPES SIMPLEX VIRUS(HSV) DNA BY PCR
HSV 1 DNA: NEGATIVE
HSV 2 DNA: NEGATIVE

## 2015-10-20 MED ORDER — CARBAMAZEPINE 200 MG PO TABS: 400.0000 mg | ORAL_TABLET | Freq: Two times a day (BID) | ORAL | 0 refills | Status: DC

## 2015-10-20 NOTE — Progress Notes (Signed)
MD making rounds. Order received to discharge home. IV removed. Prescription E-Scribed to pharmacy. Discharge paperwork provided, explained, signed and witnessed. No unanswered questions. Discharged via wheelchair by auxiliary staff. Belongings sent with patient and family.

## 2015-10-20 NOTE — Progress Notes (Signed)
   [facility] [address] [place], [place] [address]  October 20, 2015  Patient:  Victor Scott Date of Birth: [birth year] Date of Visit:  10/19/2015  To Whom it May Concern:  Please excuse Victor BeltsBartholomew Mas from work from 10/19/2015 until 10/20/15 as he was admitted to the [facility] for medical treatment and has been receiving appropriate care. He may return to work on 10/22/15, sooner if he feels he is able to return sooner than this date.      Please don't hesitate to contact me with questions or concerns by calling  [phone] and asking them to page me directly.   Marge Duncansave Beauregard Jarrells, MD

## 2015-10-20 NOTE — Progress Notes (Signed)
Back to normal baseline Mri pending, neuro consult pending  Plan DC today, possibly on keppra -- does not take tegretol due to side effects, last seizure 5 years ago

## 2015-10-20 NOTE — Discharge Summary (Signed)
[employer] [facility] [place] at [facility]   PATIENT NAME: Victor Scott    MR#:  [id number]  DATE OF BIRTH:  Oct 17, 1970  DATE OF ADMISSION:  10/19/2015 ADMITTING PHYSICIAN: Auburn Bilberry, MD  DATE OF DISCHARGE: 10/20/15  PRIMARY CARE PHYSICIAN: SOLES, MEREDITH KEY, MD    ADMISSION DIAGNOSIS:  Encephalopathy acute [G93.40]   DISCHARGE DIAGNOSIS:  Active Problems:   Acute encephalopathy seizure  SECONDARY DIAGNOSIS:   Past Medical History:  Diagnosis Date  . HIV (human immunodeficiency virus infection) (HCC)   . Seizures [facility])     HOSPITAL COURSE:  Victor Scott  is a 45 y.o. male admitted 10/19/2015 with chief complaint Altered Mental Status . Please see H&P performed by Auburn Bilberry, MD for further information. Patient presented with altered mental status after likely seizure episode. He was evaluated by neurology and recommended to continue tegretol - previously well controlled but has not taken in a few years.   DISCHARGE CONDITIONS:   stable  CONSULTS OBTAINED:  Treatment Team:  Thana Farr, MD Mick Sell, MD  DRUG ALLERGIES:   Allergies  Allergen Reactions  . Fish Allergy     DISCHARGE MEDICATIONS:   Current Discharge Medication List    START taking these medications   Details  !! carbamazepine (TEGRETOL) 200 MG tablet Take 2 tablets (400 mg total) by mouth 2 (two) times daily. Qty: 60 tablet, Refills: 0     !! - Potential duplicate medications found. Please discuss with provider.    CONTINUE these medications which have NOT CHANGED   Details  !! carbamazepine (TEGRETOL) 200 MG tablet Take 400 mg by mouth 2 (two) times daily.    darunavir-cobicistat (PREZCOBIX) 800-150 MG tablet Take 1 tablet by mouth daily. Swallow whole. Do NOT crush, break or chew tablets. Take with food.    emtricitabine-tenofovir (TRUVADA) 200-300 MG tablet Take 1 tablet by mouth daily.    metoprolol succinate (TOPROL XL) 25 MG 24  hr tablet Take 1 tablet (25 mg total) by mouth daily. Qty: 30 tablet, Refills: 11     !! - Potential duplicate medications found. Please discuss with provider.       DISCHARGE INSTRUCTIONS:   Skip tegretol for 2 days, then start Follow with Dr Sampson Goon from infectious disease as outpatient  DIET:  Regular diet  DISCHARGE CONDITION:  Good  ACTIVITY:  Activity as tolerated  OXYGEN:  Home Oxygen: No.   Oxygen Delivery: room air  DISCHARGE LOCATION:  home   If you experience worsening of your admission symptoms, develop shortness of breath, life threatening emergency, suicidal or homicidal thoughts you must seek medical attention immediately by calling 911 or calling your MD immediately  if symptoms less severe.  You Must read complete instructions/literature along with all the possible adverse reactions/side effects for all the Medicines you take and that have been prescribed to you. Take any new Medicines after you have completely understood and accpet all the possible adverse reactions/side effects.   Please note  You were cared for by a hospitalist during your hospital stay. If you have any questions about your discharge medications or the care you received while you were in the hospital after you are discharged, you can call the unit and asked to speak with the hospitalist on call if the hospitalist that took care of you is not available. Once you are discharged, your primary care physician will handle any further medical issues. Please note that NO REFILLS for any discharge medications will be  authorized once you are discharged, as it is imperative that you return to your primary care physician (or establish a relationship with a primary care physician if you do not have one) for your aftercare needs so that they can reassess your need for medications and monitor your lab values.    On the day of Discharge:   VITAL SIGNS:  Blood pressure (!) 117/51, pulse 63,  temperature 97.9 F (36.6 C), temperature source Oral, resp. rate 20, height 6\' 2"  (1.88 m), weight (!) 143.8 kg (317 lb), SpO2 99 %.  I/O:   Intake/Output Summary (Last 24 hours) at 10/20/15 1216 Last data filed at 10/20/15 0800  Gross per 24 hour  Intake              240 ml  Output                0 ml  Net              240 ml    PHYSICAL EXAMINATION:  GENERAL:  45 y.o.-year-old patient lying in the bed with no acute distress.  EYES: Pupils equal, round, reactive to light and accommodation. No scleral icterus. Extraocular muscles intact.  HEENT: Head atraumatic, normocephalic. Oropharynx and nasopharynx clear.  NECK:  Supple, no jugular venous distention. No thyroid enlargement, no tenderness.  LUNGS: Normal breath sounds bilaterally, no wheezing, rales,rhonchi or crepitation. No use of accessory muscles of respiration.  CARDIOVASCULAR: S1, S2 normal. No murmurs, rubs, or gallops.  ABDOMEN: Soft, non-tender, non-distended. Bowel sounds present. No organomegaly or mass.  EXTREMITIES: No pedal edema, cyanosis, or clubbing.  NEUROLOGIC: Cranial nerves II through XII are intact. Muscle strength 5/5 in all extremities. Sensation intact. Gait not checked.  PSYCHIATRIC: The patient is alert and oriented x 3.  SKIN: No obvious rash, lesion, or ulcer.   DATA REVIEW:   CBC  Recent Labs Lab 10/20/15 0517  WBC 3.4*  HGB 15.2  HCT 46.2  PLT 175    Chemistries   Recent Labs Lab 10/19/15 0943 10/20/15 0517  NA 138 139  K 3.2* 3.9  CL 108 108  CO2 22 24  GLUCOSE 134* 129*  BUN 8 10  CREATININE 0.99 0.91  CALCIUM 9.1 9.0  AST 37  --   ALT 42  --   ALKPHOS 57  --   BILITOT 0.8  --     Cardiac Enzymes No results for input(s): TROPONINI in the last 168 hours.  Microbiology Results  Results for orders placed or performed during the hospital encounter of 10/19/15  CSF culture     Status: None (Preliminary result)   Collection Time: 10/19/15  2:20 PM  Result Value Ref  Range Status   Specimen Description CSF  Final   Special Requests NONE  Final   Gram Stain NO WBC SEEN NO RBC SEEN NO ORGANISMS SEEN   Final   Culture   Final    NO GROWTH < 24 HOURS Performed at [facility]    Report Status PENDING  Incomplete    RADIOLOGY:  Ct Head Wo Contrast  Result Date: 10/19/2015 CLINICAL DATA:  45 year old male with recent history of altered mental status. History of seizures, with last witnessed seizure yesterday. EXAM: CT HEAD WITHOUT CONTRAST TECHNIQUE: Contiguous axial images were obtained from the base of the skull through the vertex without intravenous contrast. COMPARISON:  No priors. FINDINGS: Brain: No evidence of acute infarction, hemorrhage, hydrocephalus, extra-axial collection or mass lesion/mass effect. Vascular: No hyperdense  vessel or unexpected calcification. Skull: Normal. Negative for fracture or focal lesion. Sinuses/Orbits: No acute finding. Other: None. IMPRESSION: 1. No acute intracranial abnormalities. 2. The appearance of the brain is normal. Electronically Signed   By: Trudie Reedaniel  Entrikin M.D.   On: 10/19/2015 10:11   Mr Brain Wo Contrast  Result Date: 10/20/2015 CLINICAL DATA:  45 year old male with possible unwitnessed seizure yesterday. HIV positive. Initial encounter. EXAM: MRI HEAD WITHOUT CONTRAST TECHNIQUE: Multiplanar, multiecho pulse sequences of the brain and surrounding structures were obtained without intravenous contrast. COMPARISON:  Noncontrast head CT 10/19/2015. FINDINGS: Brain: Cerebral volume appears normal. No restricted diffusion to suggest acute infarction. No midline shift, mass effect, evidence of mass lesion, ventriculomegaly, extra-axial collection or acute intracranial hemorrhage. Cervicomedullary junction and pituitary are within normal limits. Area of susceptibility artifact at the anterior right vertex of unclear etiology. No cortical encephalomalacia or definite chronic cerebral blood products. Essentially  normal for age gray and white matter signal throughout the brain. Hippocampal formations appear symmetric and within normal limits. Vascular: Major intracranial vascular flow voids are preserved. Mild intracranial artery tortuosity. Skull and upper cervical spine: Negative. Normal bone marrow signal. Sinuses/Orbits: Mildly disc conjugate gaze, otherwise negative orbit soft tissues. Mild to moderate right ethmoid and maxillary sinus mucosal thickening with a small right maxillary fluid level. Other paranasal sinuses are clear. Mild pharyngeal tonsillar tissue hypertrophy. Negative scalp soft tissues. Other: Visible internal auditory structures appear normal. Mastoids are clear. IMPRESSION: 1. No acute intracranial abnormality. Normal for age noncontrast MRI appearance of the brain. 2. Mild to moderate right maxillary and ethmoid sinus inflammation. Electronically Signed   By: Odessa FlemingH  Hall M.D.   On: 10/20/2015 11:36     Management plans discussed with the patient, family and they are in agreement.  CODE STATUS:     Code Status Orders        Start     Ordered   10/19/15 1540  Full code  Continuous     10/19/15 1540    Code Status History    Date Active Date Inactive Code Status Order ID Comments User Context   This patient has a current code status but no historical code status.      TOTAL TIME TAKING CARE OF THIS PATIENT: 33 minutes.    Hower,  Mardi MainlandDavid K M.D on 10/20/2015 at 12:16 PM  Between 7am to 6pm - Pager - [phone]  After 6pm go to [email or web address] - Social research officer, governmentpassword [email or web address]  [facility] [place] [facility]  Office  [phone]  CC: Primary care physician; Lanier EnsignSOLES, MEREDITH KEY, MD

## 2015-10-20 NOTE — Consult Note (Signed)
Reason for Consult:Seizure Referring Physician: Hower  CC: Seizure  HPI: Victor Scott is an 45 y.o. male with a history of seizures who has not had a seizure in 4-5 years and is on no anticonvulsant therapy who on yesterday presented with altered mental status. At the time the patient reported that he had a seizure earlier in the day but today is more oriented and reports that this the way he presents when a seizure is about to occur.  Today he feels at baseline other than feeling dizzy.   Reports seizures started in prison in 1994.  Was never given a reason for the onset of seizures.    Past Medical History:  Diagnosis Date  . HIV (human immunodeficiency virus infection) (HCC)   . Seizures (HCC)     Past Surgical History:  Procedure Laterality Date  . none      Family History  Problem Relation Age of Onset  . Hypertension Father     Social History:  reports that he has been smoking Cigarettes.  He has never used smokeless tobacco. He reports that he uses drugs, including Marijuana. He reports that he does not drink alcohol.  Allergies  Allergen Reactions  . Fish Allergy     Medications:  I have reviewed the patient's current medications. Prior to Admission:  Prescriptions Prior to Admission  Medication Sig Dispense Refill Last Dose  . carbamazepine (TEGRETOL) 200 MG tablet Take 400 mg by mouth 2 (two) times daily.   10/19/2015 at Unknown time  . darunavir-cobicistat (PREZCOBIX) 800-150 MG tablet Take 1 tablet by mouth daily. Swallow whole. Do NOT crush, break or chew tablets. Take with food.   unknown  . emtricitabine-tenofovir (TRUVADA) 200-300 MG tablet Take 1 tablet by mouth daily.   unknown  . metoprolol succinate (TOPROL XL) 25 MG 24 hr tablet Take 1 tablet (25 mg total) by mouth daily. 30 tablet 11    Scheduled: . carbamazepine  400 mg Oral BID  . carbamazepine  800 mg Oral TID  . darunavir-cobicistat  1 tablet Oral Daily  . emtricitabine-tenofovir AF  1  tablet Oral Daily  . enoxaparin (LOVENOX) injection  40 mg Subcutaneous Q12H  . sodium chloride flush  3 mL Intravenous Q12H    ROS: History obtained from the patient  General ROS: negative for - chills, fatigue, fever, night sweats, weight gain or weight loss Psychological ROS: negative for - behavioral disorder, hallucinations, memory difficulties, mood swings or suicidal ideation Ophthalmic ROS: negative for - blurry vision, double vision, eye pain or loss of vision ENT ROS: dizziness Allergy and Immunology ROS: negative for - hives or itchy/watery eyes Hematological and Lymphatic ROS: negative for - bleeding problems, bruising or swollen lymph nodes Endocrine ROS: negative for - galactorrhea, hair pattern changes, polydipsia/polyuria or temperature intolerance Respiratory ROS: negative for - cough, hemoptysis, shortness of breath or wheezing Cardiovascular ROS: negative for - chest pain, dyspnea on exertion, edema or irregular heartbeat Gastrointestinal ROS: negative for - abdominal pain, diarrhea, hematemesis, nausea/vomiting or stool incontinence Genito-Urinary ROS: negative for - dysuria, hematuria, incontinence or urinary frequency/urgency Musculoskeletal ROS: negative for - joint swelling or muscular weakness Neurological ROS: as noted in HPI Dermatological ROS: negative for rash and skin lesion changes  Physical Examination: Blood pressure (!) 117/51, pulse 63, temperature 97.9 F (36.6 C), temperature source Oral, resp. rate 20, height 6\' 2"  (1.88 m), weight (!) 143.8 kg (317 lb), SpO2 99 %.  HEENT-  Normocephalic, no lesions, without obvious abnormality.  Normal  external eye and conjunctiva.  Normal TM's bilaterally.  Normal auditory canals and external ears. Normal external nose, mucus membranes and septum.  Normal pharynx. Cardiovascular- S1, S2 normal, pulses palpable throughout   Lungs- chest clear, no wheezing, rales, normal symmetric air entry Abdomen- soft,  non-tender; bowel sounds normal; no masses,  no organomegaly Extremities- no edema Lymph-no adenopathy palpable Musculoskeletal-no joint tenderness, deformity or swelling Skin-warm and dry, no hyperpigmentation, vitiligo, or suspicious lesions  Neurological Examination Mental Status: Alert, oriented, thought content appropriate.  Speech fluent without evidence of aphasia.  Able to follow 3 step commands without difficulty. Cranial Nerves: II: Discs flat bilaterally; Visual fields grossly normal, pupils equal, round, reactive to light and accommodation III,IV, VI: ptosis not present, extra-ocular motions intact bilaterally V,VII: smile symmetric, facial light touch sensation normal bilaterally VIII: hearing normal bilaterally IX,X: gag reflex present XI: bilateral shoulder shrug XII: midline tongue extension Motor: Right : Upper extremity   5/5    Left:     Upper extremity   5/5  Lower extremity   5/5     Lower extremity   5/5 Tone and bulk:normal tone throughout; no atrophy noted Sensory: Pinprick and light touch intact throughout, bilaterally Deep Tendon Reflexes: 2+ and symmetric throughout Plantars: Right: downgoing   Left: downgoing Cerebellar: Normal finger-to-nose and normal heel-to-shin testing bilaterally Gait: not tested due to safety concerns    Laboratory Studies:   Basic Metabolic Panel:  Recent Labs Lab 10/19/15 0943 10/20/15 0517  NA 138 139  K 3.2* 3.9  CL 108 108  CO2 22 24  GLUCOSE 134* 129*  BUN 8 10  CREATININE 0.99 0.91  CALCIUM 9.1 9.0    Liver Function Tests:  Recent Labs Lab 10/19/15 0943  AST 37  ALT 42  ALKPHOS 57  BILITOT 0.8  PROT 8.3*  ALBUMIN 4.1   No results for input(s): LIPASE, AMYLASE in the last 168 hours. No results for input(s): AMMONIA in the last 168 hours.  CBC:  Recent Labs Lab 10/19/15 0943 10/20/15 0517  WBC 3.7* 3.4*  HGB 15.3 15.2  HCT 46.3 46.2  MCV 70.9* 71.4*  PLT 191 175    Cardiac Enzymes: No  results for input(s): CKTOTAL, CKMB, CKMBINDEX, TROPONINI in the last 168 hours.  BNP: Invalid input(s): POCBNP  CBG: No results for input(s): GLUCAP in the last 168 hours.  Microbiology: Results for orders placed or performed during the hospital encounter of 10/19/15  CSF culture     Status: None (Preliminary result)   Collection Time: 10/19/15  2:20 PM  Result Value Ref Range Status   Specimen Description CSF  Final   Special Requests NONE  Final   Gram Stain NO WBC SEEN NO RBC SEEN NO ORGANISMS SEEN   Final   Culture   Final    NO GROWTH < 24 HOURS Performed at [facility]    Report Status PENDING  Incomplete    Coagulation Studies: No results for input(s): LABPROT, INR in the last 72 hours.  Urinalysis:  Recent Labs Lab 10/19/15 0943  COLORURINE YELLOW*  LABSPEC 1.015  PHURINE 6.0  GLUCOSEU NEGATIVE  HGBUR NEGATIVE  BILIRUBINUR NEGATIVE  KETONESUR NEGATIVE  PROTEINUR NEGATIVE  NITRITE NEGATIVE  LEUKOCYTESUR NEGATIVE    Lipid Panel:  No results found for: CHOL, TRIG, HDL, CHOLHDL, VLDL, LDLCALC  HgbA1C: No results found for: HGBA1C  Urine Drug Screen:     Component Value Date/Time   LABOPIA NONE DETECTED 10/19/2015 0943   COCAINSCRNUR NONE  DETECTED 10/19/2015 0943   LABBENZ NONE DETECTED 10/19/2015 0943   AMPHETMU NONE DETECTED 10/19/2015 0943   THCU POSITIVE (A) 10/19/2015 0943   LABBARB NONE DETECTED 10/19/2015 0943    Alcohol Level:  Recent Labs Lab 10/19/15 1230  ETH <5    Other results: EKG: normal sinus rhythm at 71 bpm.  Imaging: Ct Head Wo Contrast  Result Date: 10/19/2015 CLINICAL DATA:  45 year old male with recent history of altered mental status. History of seizures, with last witnessed seizure yesterday. EXAM: CT HEAD WITHOUT CONTRAST TECHNIQUE: Contiguous axial images were obtained from the base of the skull through the vertex without intravenous contrast. COMPARISON:  No priors. FINDINGS: Brain: No evidence of acute  infarction, hemorrhage, hydrocephalus, extra-axial collection or mass lesion/mass effect. Vascular: No hyperdense vessel or unexpected calcification. Skull: Normal. Negative for fracture or focal lesion. Sinuses/Orbits: No acute finding. Other: None. IMPRESSION: 1. No acute intracranial abnormalities. 2. The appearance of the brain is normal. Electronically Signed   By: Trudie Reed M.D.   On: 10/19/2015 10:11   Mr Brain Wo Contrast  Result Date: 10/20/2015 CLINICAL DATA:  45 year old male with possible unwitnessed seizure yesterday. HIV positive. Initial encounter. EXAM: MRI HEAD WITHOUT CONTRAST TECHNIQUE: Multiplanar, multiecho pulse sequences of the brain and surrounding structures were obtained without intravenous contrast. COMPARISON:  Noncontrast head CT 10/19/2015. FINDINGS: Brain: Cerebral volume appears normal. No restricted diffusion to suggest acute infarction. No midline shift, mass effect, evidence of mass lesion, ventriculomegaly, extra-axial collection or acute intracranial hemorrhage. Cervicomedullary junction and pituitary are within normal limits. Area of susceptibility artifact at the anterior right vertex of unclear etiology. No cortical encephalomalacia or definite chronic cerebral blood products. Essentially normal for age gray and white matter signal throughout the brain. Hippocampal formations appear symmetric and within normal limits. Vascular: Major intracranial vascular flow voids are preserved. Mild intracranial artery tortuosity. Skull and upper cervical spine: Negative. Normal bone marrow signal. Sinuses/Orbits: Mildly disc conjugate gaze, otherwise negative orbit soft tissues. Mild to moderate right ethmoid and maxillary sinus mucosal thickening with a small right maxillary fluid level. Other paranasal sinuses are clear. Mild pharyngeal tonsillar tissue hypertrophy. Negative scalp soft tissues. Other: Visible internal auditory structures appear normal. Mastoids are clear.  IMPRESSION: 1. No acute intracranial abnormality. Normal for age noncontrast MRI appearance of the brain. 2. Mild to moderate right maxillary and ethmoid sinus inflammation. Electronically Signed   By: Odessa Fleming M.D.   On: 10/20/2015 11:36     Assessment/Plan: 45 year old HIV positive male presenting with confusion felt to be secondary to seizure.  Patient on no anticonvulsant therapy due to being seizure free for the past 4-5 years.  Head CT personally reviewed and shows no acute changes.  MRI of the brain reviewed and unremarkable.  LP shows no evidence of infection.    Recommendations: 1.  Patient receiving more than baseline dose of Tegretol.  To hold Tegretol for two days then restart at 400mg  BID. 2.  Follow up with his physician on an outpatient basis. 3.  Patient unable to drive, operate heavy machinery, perform activities at heights and participate in water activities until release by outpatient physician. 4.  Seizure precautions  Thana Farr, MD Neurology [phone] 10/20/2015, 1:07 PM

## 2015-10-21 LAB — HELPER T-LYMPH-CD4 (ARMC ONLY)
% CD 4 Pos. Lymph.: 10.9 % — ABNORMAL LOW (ref 30.8–58.5)
Absolute CD 4 Helper: 174 /uL — ABNORMAL LOW (ref 359–1519)
BASOS: 0 %
Basophils Absolute: 0 10*3/uL (ref 0.0–0.2)
EOS (ABSOLUTE): 0.1 10*3/uL (ref 0.0–0.4)
Eos: 3 %
HEMATOCRIT: 47.3 % (ref 37.5–51.0)
Hemoglobin: 15.7 g/dL (ref 12.6–17.7)
IMMATURE GRANS (ABS): 0 10*3/uL (ref 0.0–0.1)
Immature Granulocytes: 1 %
LYMPHS: 46 %
Lymphocytes Absolute: 1.6 10*3/uL (ref 0.7–3.1)
MCH: 23.1 pg — ABNORMAL LOW (ref 26.6–33.0)
MCHC: 33.2 g/dL (ref 31.5–35.7)
MCV: 70 fL — ABNORMAL LOW (ref 79–97)
Monocytes Absolute: 0.3 10*3/uL (ref 0.1–0.9)
Monocytes: 9 %
NEUTROS ABS: 1.4 10*3/uL (ref 1.4–7.0)
Neutrophils: 41 %
Platelets: 182 10*3/uL (ref 150–379)
RBC: 6.81 x10E6/uL — AB (ref 4.14–5.80)
RDW: 15.6 % — ABNORMAL HIGH (ref 12.3–15.4)
WBC: 3.4 10*3/uL (ref 3.4–10.8)

## 2015-10-21 LAB — HIV-1 RNA QUANT-NO REFLEX-BLD
HIV 1 RNA Quant: 257000 copies/mL
LOG10 HIV-1 RNA: 5.41 log10copy/mL

## 2015-10-23 LAB — CSF CULTURE W GRAM STAIN
Culture: NO GROWTH
Gram Stain: NONE SEEN

## 2015-10-23 LAB — CSF CULTURE

## 2015-12-13 ENCOUNTER — Emergency Department
Admission: EM | Admit: 2015-12-13 | Discharge: 2015-12-13 | Disposition: A | Discharge status: Home / Self Care | Payer: Self-pay | Attending: Emergency Medicine | Admitting: Emergency Medicine

## 2015-12-13 ENCOUNTER — Encounter: Payer: Self-pay | Admitting: *Deleted

## 2015-12-13 DIAGNOSIS — Z79899 Other long term (current) drug therapy: Secondary | ICD-10-CM | POA: Insufficient documentation

## 2015-12-13 DIAGNOSIS — F17210 Nicotine dependence, cigarettes, uncomplicated: Secondary | ICD-10-CM | POA: Insufficient documentation

## 2015-12-13 DIAGNOSIS — Z21 Asymptomatic human immunodeficiency virus [HIV] infection status: Secondary | ICD-10-CM | POA: Insufficient documentation

## 2015-12-13 DIAGNOSIS — G40909 Epilepsy, unspecified, not intractable, without status epilepticus: Secondary | ICD-10-CM | POA: Insufficient documentation

## 2015-12-13 DIAGNOSIS — R569 Unspecified convulsions: Secondary | ICD-10-CM

## 2015-12-13 LAB — CBC
HEMATOCRIT: 45.6 % (ref 40.0–52.0)
Hemoglobin: 14.6 g/dL (ref 13.0–18.0)
MCH: 23.3 pg — AB (ref 26.0–34.0)
MCHC: 32.1 g/dL (ref 32.0–36.0)
MCV: 72.4 fL — AB (ref 80.0–100.0)
Platelets: 188 10*3/uL (ref 150–440)
RBC: 6.29 MIL/uL — ABNORMAL HIGH (ref 4.40–5.90)
RDW: 16.6 % — AB (ref 11.5–14.5)
WBC: 5.9 10*3/uL (ref 3.8–10.6)

## 2015-12-13 LAB — BASIC METABOLIC PANEL
ANION GAP: 11 (ref 5–15)
BUN: 8 mg/dL (ref 6–20)
CHLORIDE: 109 mmol/L (ref 101–111)
CO2: 19 mmol/L — ABNORMAL LOW (ref 22–32)
Calcium: 9.3 mg/dL (ref 8.9–10.3)
Creatinine, Ser: 1.22 mg/dL (ref 0.61–1.24)
GFR calc Af Amer: >60 mL/min (ref >60)
GLUCOSE: 154 mg/dL — AB (ref 65–99)
POTASSIUM: 3.6 mmol/L (ref 3.5–5.1)
Sodium: 139 mmol/L (ref 135–145)

## 2015-12-13 MED ORDER — LEVETIRACETAM 500 MG/5ML IV SOLN
1500.0000 mg | Freq: Once | INTRAVENOUS | Status: AC
  Administered 2015-12-13: 1500 mg via INTRAVENOUS
  Filled 2015-12-13: qty 15

## 2015-12-13 NOTE — ED Provider Notes (Signed)
[facility] Emergency Department Provider Note  Time seen: 11:10 AM  I have reviewed the triage vital signs and the nursing notes.   HISTORY  Chief Complaint Seizures    HPI Victor Scott is a 45 y.o. male with a past medical history of HIV, seizure disorder on Keppra who presents to the emergency department after a seizure. According to the patient and his family member he was at the dentist this morning when he experienced either a prolonged seizure or back-to-back seizure. Patient was given Valium 10 mg at the dentist office and brought to the emergency department. Upon arrival staff reports the patient was very lethargic/somnolent. Patient is now awake, still is somnolent but able to answer questions appropriately. Patient denies any pain. States his last seizure was approximately 2 months ago. States he was recently switched from Tegretol to Keppra by Dr. Sampson GoonFitzgerald. Denies missing any doses of his Keppra.Patient denies any complaints at this time besides feeling fatigued.  Past Medical History:  Diagnosis Date  . HIV (human immunodeficiency virus infection) (HCC)   . Seizures [facility](HCC)     Patient Active Problem List   Diagnosis Date Noted  . Acute encephalopathy 10/19/2015    Past Surgical History:  Procedure Laterality Date  . none      Prior to Admission medications   Medication Sig Start Date End Date Taking? Authorizing Provider  carbamazepine (TEGRETOL) 200 MG tablet Take 400 mg by mouth 2 (two) times daily.    Historical Provider, MD  carbamazepine (TEGRETOL) 200 MG tablet Take 2 tablets (400 mg total) by mouth 2 (two) times daily. 10/20/15   Wyatt Hasteavid K Hower, MD  darunavir-cobicistat (PREZCOBIX) 800-150 MG tablet Take 1 tablet by mouth daily. Swallow whole. Do NOT crush, break or chew tablets. Take with food.    Historical Provider, MD  emtricitabine-tenofovir (TRUVADA) 200-300 MG tablet Take 1 tablet by mouth daily.    Historical Provider, MD   metoprolol succinate (TOPROL XL) 25 MG 24 hr tablet Take 1 tablet (25 mg total) by mouth daily. 05/24/15 05/23/16  Emily FilbertJonathan E Williams, MD    Allergies  Allergen Reactions  . Fish Allergy     Family History  Problem Relation Age of Onset  . Hypertension Father     Social History Social History  Substance Use Topics  . Smoking status: Current Every Day Smoker    Types: Cigarettes  . Smokeless tobacco: Never Used  . Alcohol use No    Review of Systems Constitutional: Negative for fever. Cardiovascular: Negative for chest pain. Respiratory: Negative for shortness of breath. Gastrointestinal: Negative for abdominal pain Musculoskeletal: Negative for back pain Neurological: Negative for headache 10-point ROS otherwise negative.  ____________________________________________   PHYSICAL EXAM:  VITAL SIGNS: ED Triage Vitals  Enc Vitals Group     BP 12/13/15 1049 (!) 148/89     Pulse Rate 12/13/15 1049 88     Resp 12/13/15 1049 20     Temp 12/13/15 1049 98.6 F (37 C)     Temp Source 12/13/15 1049 Oral     SpO2 12/13/15 1049 96 %     Weight 12/13/15 1050 (!) 320 lb (145.2 kg)     Height 12/13/15 1050 6\' 3"  (1.905 m)     Head Circumference --      Peak Flow --      Pain Score --      Pain Loc --      Pain Edu? --      Excl.  in GC? --     Constitutional: Alert and oriented. Well appearing and in no distress.Somnolent, but awakens appropriately to voice. Eyes: Normal exam ENT   Head: Normocephalic and atraumatic.   Mouth/Throat: Mucous membranes are moist. Cardiovascular: Normal rate, regular rhythm. No murmur Respiratory: Normal respiratory effort without tachypnea nor retractions. Breath sounds are clear  Gastrointestinal: Soft and nontender. No distention.   Musculoskeletal: Nontender with normal range of motion in all extremities. Neurologic:  Normal speech and language. No gross focal neurologic deficits  Skin:  Skin is warm, dry and intact.   Psychiatric: Mood and affect are normal.   ____________________________________________   INITIAL IMPRESSION / ASSESSMENT AND PLAN / ED COURSE  Pertinent labs & imaging results that were available during my care of the patient were reviewed by me and considered in my medical decision making (see chart for details).  Patient presents to the emergency department after a seizure this morning while at the dentist office. Patient was at the dentist's office for a tooth extraction. It is unclear at this time if he was receiving anesthesia or not. Patient had a prolonged versus multiple seizures at the dentist office. Was given 10 mg of Valium. Patient is awake, somewhat somnolent but awakens easily to voice and can answer questions in the emergency department. Follows commands. Overall normal physical examination besides mild to moderate somnolence. We will check basic labs, loaded with Keppra and monitor closely in the emergency department. Labs within normal limits patient will likely be discharged with neurology follow-up. Patient is agreeable to this plan.  Patient's labs are largely within normal limits. Patient has been loaded with Keppra. Currently appears well, no further seizure-like activity in the emergency department. Patient will be discharged home with PCP as well as neurology follow-up. Patient family are agreeable.  ____________________________________________   FINAL CLINICAL IMPRESSION(S) / ED DIAGNOSES  Seizure    Minna AntisKevin Lasasha Brophy, MD 12/13/15 1249

## 2015-12-13 NOTE — ED Notes (Signed)
Called lab to get pharmacy to send Keppra to ED

## 2015-12-13 NOTE — ED Notes (Signed)
Pt states he was at dentist just having a check-up, not a procedure. Pt states hx of seizures, has been "a while" since last seizure. Pt is alert and oriented at this time. Visitor at bedside. Pt speaking in complete sentences. Seizure pads on each side rail, side rails up. Pt denies pain at this time.

## 2015-12-13 NOTE — ED Triage Notes (Addendum)
Pt was having his teeth cleaned when he had a seizure lasting 2-3 minutes per EMS, pt reports a history of seizure, pt is alert to self, lethargic' post ictal, pt transported by EMS, pt given Valium 10mg  PR

## 2016-01-21 ENCOUNTER — Emergency Department
Admission: EM | Admit: 2016-01-21 | Discharge: 2016-01-21 | Disposition: A | Discharge status: Home / Self Care | Payer: Self-pay | Attending: Emergency Medicine | Admitting: Emergency Medicine

## 2016-01-21 DIAGNOSIS — G40909 Epilepsy, unspecified, not intractable, without status epilepticus: Secondary | ICD-10-CM | POA: Insufficient documentation

## 2016-01-21 DIAGNOSIS — Z21 Asymptomatic human immunodeficiency virus [HIV] infection status: Secondary | ICD-10-CM | POA: Insufficient documentation

## 2016-01-21 DIAGNOSIS — F17210 Nicotine dependence, cigarettes, uncomplicated: Secondary | ICD-10-CM | POA: Insufficient documentation

## 2016-01-21 DIAGNOSIS — R569 Unspecified convulsions: Secondary | ICD-10-CM

## 2016-01-21 DIAGNOSIS — Z79899 Other long term (current) drug therapy: Secondary | ICD-10-CM | POA: Insufficient documentation

## 2016-01-21 LAB — URINALYSIS, ROUTINE W REFLEX MICROSCOPIC
BILIRUBIN URINE: NEGATIVE
Glucose, UA: NEGATIVE mg/dL
Hgb urine dipstick: NEGATIVE
Ketones, ur: NEGATIVE mg/dL
Leukocytes, UA: NEGATIVE
NITRITE: NEGATIVE
PH: 6 (ref 5.0–8.0)
Protein, ur: NEGATIVE mg/dL
SPECIFIC GRAVITY, URINE: 1.012 (ref 1.005–1.030)

## 2016-01-21 LAB — CBC
HEMATOCRIT: 46.4 % (ref 40.0–52.0)
HEMOGLOBIN: 15 g/dL (ref 13.0–18.0)
MCH: 23.7 pg — AB (ref 26.0–34.0)
MCHC: 32.4 g/dL (ref 32.0–36.0)
MCV: 73.3 fL — AB (ref 80.0–100.0)
Platelets: 229 10*3/uL (ref 150–440)
RBC: 6.34 MIL/uL — ABNORMAL HIGH (ref 4.40–5.90)
RDW: 16.3 % — ABNORMAL HIGH (ref 11.5–14.5)
WBC: 6.8 10*3/uL (ref 3.8–10.6)

## 2016-01-21 LAB — URINE DRUG SCREEN, QUALITATIVE (ARMC ONLY)
AMPHETAMINES, UR SCREEN: NOT DETECTED
BARBITURATES, UR SCREEN: NOT DETECTED
Benzodiazepine, Ur Scrn: NOT DETECTED
COCAINE METABOLITE, UR ~~LOC~~: NOT DETECTED
Cannabinoid 50 Ng, Ur ~~LOC~~: POSITIVE — AB
MDMA (Ecstasy)Ur Screen: NOT DETECTED
METHADONE SCREEN, URINE: NOT DETECTED
OPIATE, UR SCREEN: NOT DETECTED
Phencyclidine (PCP) Ur S: NOT DETECTED
Tricyclic, Ur Screen: NOT DETECTED

## 2016-01-21 LAB — RAPID INFLUENZA A&B ANTIGENS
Influenza A (ARMC): NEGATIVE
Influenza B (ARMC): NEGATIVE

## 2016-01-21 LAB — BASIC METABOLIC PANEL
ANION GAP: 6 (ref 5–15)
Anion gap: 10 (ref 5–15)
BUN: 6 mg/dL (ref 6–20)
BUN: 7 mg/dL (ref 6–20)
CHLORIDE: 107 mmol/L (ref 101–111)
CHLORIDE: 111 mmol/L (ref 101–111)
CO2: 21 mmol/L — AB (ref 22–32)
CO2: 24 mmol/L (ref 22–32)
CREATININE: 1.13 mg/dL (ref 0.61–1.24)
Calcium: 6.4 mg/dL — CL (ref 8.9–10.3)
Calcium: 9.1 mg/dL (ref 8.9–10.3)
Creatinine, Ser: 1.04 mg/dL (ref 0.61–1.24)
GFR calc non Af Amer: >60 mL/min (ref >60)
GFR calc non Af Amer: >60 mL/min (ref >60)
Glucose, Bld: 114 mg/dL — ABNORMAL HIGH (ref 65–99)
Glucose, Bld: 94 mg/dL (ref 65–99)
POTASSIUM: 3.5 mmol/L (ref 3.5–5.1)
Potassium: 6.2 mmol/L — ABNORMAL HIGH (ref 3.5–5.1)
SODIUM: 141 mmol/L (ref 135–145)
Sodium: 138 mmol/L (ref 135–145)

## 2016-01-21 MED ORDER — SODIUM CHLORIDE 0.9 % IV BOLUS (SEPSIS)
1000.0000 mL | Freq: Once | INTRAVENOUS | Status: AC
  Administered 2016-01-21: 1000 mL via INTRAVENOUS

## 2016-01-21 MED ORDER — LEVETIRACETAM 500 MG PO TABS
1500.0000 mg | ORAL_TABLET | Freq: Once | ORAL | Status: AC
  Administered 2016-01-21: 1500 mg via ORAL
  Filled 2016-01-21: qty 3

## 2016-01-21 NOTE — ED Notes (Signed)
Pt is still appearing to be confused at this time. Pt stating that he does not know where he is. Pt does not remember taking out his IV . Pt up to the bathroom. Pt stable on his feet. Nurse gave pt warm blanket and had him lay back on his stretcher.

## 2016-01-21 NOTE — ED Triage Notes (Signed)
Pt to ED c/o seizure. Per EMS pt family reported he had seizures x2 today. Pt alert and oriented to self only, in no acute distress at this time.

## 2016-01-21 NOTE — ED Notes (Signed)
On nurse's arrival to room pt had removed all monitoring equipment, gown and pulled his IV . Nurse is estamting pt received 500ml of NS bolus. Dr. Lenard LancePaduchowski notified. Nurse is to collect BMP at this time.

## 2016-01-21 NOTE — ED Notes (Addendum)
Dr. Lenard LancePaduchowski is having pt receive fluids and a dose of Keppra. Pt's BMP to be re-evaluated after fluids. Pt in NAD at this time. Pt and family member is aware of doctor's plan.

## 2016-01-21 NOTE — Discharge Instructions (Signed)
Please follow-up with your neurologist as soon as possible regarding today's seizure. Please take her Keppra twice daily as prescribed. Return to the emergency department for any further seizures, or any other symptom personally concerning to yourself.

## 2016-01-21 NOTE — ED Notes (Signed)
Dr. Lenard LancePaduchowski notified of pt's calcium level of 6.4

## 2016-01-21 NOTE — ED Notes (Signed)
Pt's family member stating that pt had two seizures back to back this evening. Pt was laying in bed when these seizure happened. Pt did not hit head or fall. Pt appears to still be post-ictal. Pt is sleepy, but able to answer his name and birthday. Pt is denying any pain. Pt's family stating that pt had 3 similar seizures this previous November,but has not been able to [facility] neurolog[facility] since they were not able to get an appointment. Pt in NAD at this time and stretcher bed rails are up.

## 2016-01-21 NOTE — ED Provider Notes (Signed)
[facility] Emergency Department Provider Note  Time seen: 6:50 PM  I have reviewed the triage vital signs and the nursing notes.   HISTORY  Chief Complaint Seizures    HPI Victor Scott is a 46 y.o. male with a past medical history of HIV, seizure disorder on Keppra who presents to the emergency department after 2 seizures. According to EMS family reported 2 seizures today. Patient states his last seizure was in November. Denies missing any doses of his Keppra. Denies any recent fever, cough congestion and abdominal pain nausea vomiting or diarrhea. Denies alcohol use. Patient is somewhat somnolent but able to converse well. EMS per wife state the patient is in his normal postictal state.  Past Medical History:  Diagnosis Date  . HIV (human immunodeficiency virus infection) (HCC)   . Seizures [facility])     Patient Active Problem List   Diagnosis Date Noted  . Acute encephalopathy 10/19/2015    Past Surgical History:  Procedure Laterality Date  . none      Prior to Admission medications   Medication Sig Start Date End Date Taking? Authorizing Provider  carbamazepine (TEGRETOL) 200 MG tablet Take 400 mg by mouth 2 (two) times daily.    Historical Provider, MD  carbamazepine (TEGRETOL) 200 MG tablet Take 2 tablets (400 mg total) by mouth 2 (two) times daily. 10/20/15   Wyatt Haste, MD  darunavir-cobicistat (PREZCOBIX) 800-150 MG tablet Take 1 tablet by mouth daily. Swallow whole. Do NOT crush, break or chew tablets. Take with food.    Historical Provider, MD  emtricitabine-tenofovir (TRUVADA) 200-300 MG tablet Take 1 tablet by mouth daily.    Historical Provider, MD  metoprolol succinate (TOPROL XL) 25 MG 24 hr tablet Take 1 tablet (25 mg total) by mouth daily. 05/24/15 05/23/16  Emily Filbert, MD    Allergies  Allergen Reactions  . Fish Allergy     Family History  Problem Relation Age of Onset  . Hypertension Father     Social  History Social History  Substance Use Topics  . Smoking status: Current Every Day Smoker    Types: Cigarettes  . Smokeless tobacco: Never Used  . Alcohol use No    Review of Systems Constitutional: Negative for fever. Cardiovascular: Negative for chest pain. Respiratory: Negative for shortness of breath. Gastrointestinal: Negative for abdominal pain, vomiting and diarrhea. Genitourinary: Negative for dysuria. Neurological: Negative for headache 10-point ROS otherwise negative.  ____________________________________________   PHYSICAL EXAM:  VITAL SIGNS: ED Triage Vitals  Enc Vitals Group     BP 01/21/16 1840 (!) 168/95     Pulse Rate 01/21/16 1840 93     Resp 01/21/16 1840 20     Temp 01/21/16 1840 98.3 F (36.8 C)     Temp Source 01/21/16 1837 Oral     SpO2 01/21/16 1840 91 %     Weight 01/21/16 1841 (!) 325 lb (147.4 kg)     Height 01/21/16 1841 6\' 1"  (1.854 m)     Head Circumference --      Peak Flow --      Pain Score --      Pain Loc --      Pain Edu? --      Excl. in GC? --     Constitutional: Alert and oriented. Somewhat somnolent but awakens to voice and answers questions appropriate follows commands appropriately. Eyes: Normal exam ENT   Head: Normocephalic and atraumatic.   Mouth/Throat: Mucous membranes are moist. Cardiovascular:  Normal rate, regular rhythm. No murmurs, rubs, or gallops. Respiratory: Normal respiratory effort without tachypnea nor retractions. Breath sounds are clear  Gastrointestinal: Soft and nontender. No distention.  Musculoskeletal: Nontender with normal range of motion in all extremities.  Neurologic:  Normal speech and language. No gross focal neurologic deficits are appreciated. Skin:  Skin is warm, dry and intact.  Psychiatric: Mood and affect are normal. Speech and behavior are normal.   ____________________________________________    INITIAL IMPRESSION / ASSESSMENT AND PLAN / ED COURSE  Pertinent labs & imaging  results that were available during my care of the patient were reviewed by me and considered in my medical decision making (see chart for details).  The patient presents the emergency department with reported seizure today. Patient denies missing any doses of his Keppra. We will check labs, urinalysis, influenza A and closely monitor in the emergency department.  Patient's initial chemistry showed a low calcium and high potassium. As the patient was in his postictal state after a seizure we will IV hydrate and repeat the chemistry. Otherwise his labs are largely within normal limits influenza negative.  Patient's repeat chemistry is completely normal. Patient is feeling better. He has received his evening dose of Keppra in the emergency department. I discussed with the patient following up with his neurologist and continue his Keppra twice a day. Patient is agreeable to plan.  ____________________________________________   FINAL CLINICAL IMPRESSION(S) / ED DIAGNOSES  Seizure    Minna AntisKevin Keria Widrig, MD 01/21/16 2258

## 2016-03-09 ENCOUNTER — Encounter: Payer: Self-pay | Admitting: Emergency Medicine

## 2016-03-09 ENCOUNTER — Emergency Department
Admission: EM | Admit: 2016-03-09 | Discharge: 2016-03-09 | Disposition: A | Discharge status: Home / Self Care | Payer: Self-pay | Attending: Emergency Medicine | Admitting: Emergency Medicine

## 2016-03-09 DIAGNOSIS — G40909 Epilepsy, unspecified, not intractable, without status epilepticus: Secondary | ICD-10-CM | POA: Insufficient documentation

## 2016-03-09 DIAGNOSIS — F17210 Nicotine dependence, cigarettes, uncomplicated: Secondary | ICD-10-CM | POA: Insufficient documentation

## 2016-03-09 DIAGNOSIS — Z21 Asymptomatic human immunodeficiency virus [HIV] infection status: Secondary | ICD-10-CM | POA: Insufficient documentation

## 2016-03-09 DIAGNOSIS — I10 Essential (primary) hypertension: Secondary | ICD-10-CM | POA: Insufficient documentation

## 2016-03-09 DIAGNOSIS — Z79899 Other long term (current) drug therapy: Secondary | ICD-10-CM | POA: Insufficient documentation

## 2016-03-09 DIAGNOSIS — R569 Unspecified convulsions: Secondary | ICD-10-CM

## 2016-03-09 HISTORY — DX: Essential (primary) hypertension: I10

## 2016-03-09 LAB — COMPREHENSIVE METABOLIC PANEL
ALT: 52 U/L (ref 17–63)
ANION GAP: 7 (ref 5–15)
AST: 39 U/L (ref 15–41)
Albumin: 4 g/dL (ref 3.5–5.0)
Alkaline Phosphatase: 66 U/L (ref 38–126)
BUN: 13 mg/dL (ref 6–20)
CALCIUM: 8.7 mg/dL — AB (ref 8.9–10.3)
CHLORIDE: 108 mmol/L (ref 101–111)
CO2: 23 mmol/L (ref 22–32)
CREATININE: 0.95 mg/dL (ref 0.61–1.24)
Glucose, Bld: 100 mg/dL — ABNORMAL HIGH (ref 65–99)
Potassium: 3.9 mmol/L (ref 3.5–5.1)
SODIUM: 138 mmol/L (ref 135–145)
Total Bilirubin: 0.7 mg/dL (ref 0.3–1.2)
Total Protein: 8.1 g/dL (ref 6.5–8.1)

## 2016-03-09 LAB — CBC WITH DIFFERENTIAL/PLATELET
BASOS PCT: 1 %
Basophils Absolute: 0.1 10*3/uL (ref 0–0.1)
EOS ABS: 0 10*3/uL (ref 0–0.7)
Eosinophils Relative: 1 %
HEMATOCRIT: 47.3 % (ref 40.0–52.0)
HEMOGLOBIN: 15.8 g/dL (ref 13.0–18.0)
Lymphocytes Relative: 17 %
Lymphs Abs: 1.2 10*3/uL (ref 1.0–3.6)
MCH: 24.5 pg — ABNORMAL LOW (ref 26.0–34.0)
MCHC: 33.4 g/dL (ref 32.0–36.0)
MCV: 73.4 fL — ABNORMAL LOW (ref 80.0–100.0)
Monocytes Absolute: 0.6 10*3/uL (ref 0.2–1.0)
Monocytes Relative: 8 %
NEUTROS ABS: 5.1 10*3/uL (ref 1.4–6.5)
NEUTROS PCT: 73 %
Platelets: 224 10*3/uL (ref 150–440)
RBC: 6.45 MIL/uL — AB (ref 4.40–5.90)
RDW: 16.1 % — ABNORMAL HIGH (ref 11.5–14.5)
WBC: 7 10*3/uL (ref 3.8–10.6)

## 2016-03-09 LAB — CARBAMAZEPINE LEVEL, TOTAL

## 2016-03-09 MED ORDER — CARBAMAZEPINE 200 MG PO TABS: 200.0000 mg | ORAL_TABLET | Freq: Two times a day (BID) | ORAL | 0 refills | Status: DC

## 2016-03-09 MED ORDER — CARBAMAZEPINE 200 MG PO TABS
200.0000 mg | ORAL_TABLET | Freq: Once | ORAL | Status: AC
  Administered 2016-03-09: 200 mg via ORAL
  Filled 2016-03-09 (×2): qty 1

## 2016-03-09 NOTE — ED Notes (Signed)
Pt discharged home after verbalizing understanding of discharge instructions; nad noted. 

## 2016-03-09 NOTE — ED Triage Notes (Signed)
Pt from home via ems after a seizure this morning. Pt has hx of same; last seizure in January. EMS reports that pt had recent medication added but that he has not had it filled so he has not been taking it. He is taking keppra. Hx of htn, seizures, HIV. Pt is able to answer questions; does not remember seizure' denies loss of bladder or bowel control during seizure.

## 2016-03-09 NOTE — ED Provider Notes (Signed)
[facility] Emergency Department Provider Note   ____________________________________________   First MD Initiated Contact with Patient 03/09/16 0730     (approximate)  I have reviewed the triage vital signs and the nursing notes.   HISTORY  Chief Complaint Seizures   HPI Victor Scott is a 46 y.o. male who came from home with EMS. He had a seizure today. Last CT was in January. EMS reports he recently had a medication added but has not had it filled. Patient says he is taking Keppra he is does not say he is taking anything else. Epic review of [facility]'s chart shows he should also be taking Tegretol. Patient initially was postictal and he is not anymore. He says he feels fine now. He says he is taking all of his other medicines.   Past Medical History:  Diagnosis Date  . HIV (human immunodeficiency virus infection) (HCC)   . Hypertension   . Seizures [facility](HCC)     Patient Active Problem List   Diagnosis Date Noted  . Acute encephalopathy 10/19/2015    Past Surgical History:  Procedure Laterality Date  . none      Prior to Admission medications   Medication Sig Start Date End Date Taking? Authorizing Provider  carbamazepine (TEGRETOL) 200 MG tablet Take 2 tablets (400 mg total) by mouth 2 (two) times daily. 10/20/15  Yes Wyatt Hasteavid K Hower, MD  darunavir-cobicistat (PREZCOBIX) 800-150 MG tablet Take 1 tablet by mouth daily. Swallow whole. Do NOT crush, break or chew tablets. Take with food.   Yes Historical Provider, MD  emtricitabine-tenofovir (TRUVADA) 200-300 MG tablet Take 1 tablet by mouth daily.   Yes Historical Provider, MD  levETIRAcetam (KEPPRA) 500 MG tablet Take 1,500 mg by mouth 2 (two) times daily.  11/12/15  Yes Historical Provider, MD  metoprolol succinate (TOPROL-XL) 50 MG 24 hr tablet Take 50 mg by mouth daily. 12/27/15  Yes Historical Provider, MD  carbamazepine (TEGRETOL) 200 MG tablet Take 1 tablet (200 mg total) by mouth 2 (two) times  daily. 03/09/16 03/09/17  Arnaldo NatalPaul F Malinda, MD    Allergies Fish allergy  Family History  Problem Relation Age of Onset  . Hypertension Father     Social History Social History  Substance Use Topics  . Smoking status: Current Every Day Smoker    Packs/day: 1.00    Types: Cigarettes  . Smokeless tobacco: Never Used  . Alcohol use No    Review of Systems Constitutional: No fever/chills Eyes: No visual changes. ENT: No sore throat. Cardiovascular: Denies chest pain. Respiratory: Denies shortness of breath. Gastrointestinal: No abdominal pain.  No nausea, no vomiting.  No diarrhea.  No constipation. Genitourinary: Negative for dysuria. Musculoskeletal: Negative for back pain. Skin: Negative for rash. Neurological: Negative for headaches, focal weakness or numbness.  10-point ROS otherwise negative.  ____________________________________________   PHYSICAL EXAM:  VITAL SIGNS: ED Triage Vitals  Enc Vitals Group     BP 03/09/16 0724 (!) 142/84     Pulse Rate 03/09/16 0724 87     Resp 03/09/16 0724 (!) 27     Temp 03/09/16 0724 98.3 F (36.8 C)     Temp Source 03/09/16 0724 Oral     SpO2 03/09/16 0724 97 %     Weight 03/09/16 0727 (!) 325 lb (147.4 kg)     Height 03/09/16 0727 6\' 1"  (1.854 m)     Head Circumference --      Peak Flow --      Pain  Score 03/09/16 0728 10     Pain Loc --      Pain Edu? --      Excl. in GC? --    Constitutional: Alert and oriented. Well appearing and in no acute distress. Eyes: Conjunctivae are normal. PERRL. EOMI. Head: Atraumatic. Nose: No congestion/rhinnorhea. Mouth/Throat: Mucous membranes are moist.  Oropharynx non-erythematous. Neck: No stridor.  Cardiovascular: Normal rate, regular rhythm. Grossly normal heart sounds.  Good peripheral circulation. Respiratory: Normal respiratory effort.  No retractions. Lungs CTAB. Gastrointestinal: Soft and nontender. No distention. No abdominal bruits. No CVA tenderness. Musculoskeletal: No  lower extremity tenderness nor edema.  No joint effusions. Neurologic:  Normal speech and language. No gross focal neurologic deficits are appreciated. Cranial nerves II through XII are intact cerebellar finger-nose rapid alternating movements are normal motor strength is 5 over 5 throughout sensation is intact throughout. No gait instability. Skin:  Skin is warm, dry and intact. No rash noted. Psychiatric: Mood and affect are normal. Speech and behavior are normal.  ____________________________________________   LABS (all labs ordered are listed, but only abnormal results are displayed)  Labs Reviewed  COMPREHENSIVE METABOLIC PANEL - Abnormal; Notable for the following:       Result Value   Glucose, Bld 100 (*)    Calcium 8.7 (*)    All other components within normal limits  CBC WITH DIFFERENTIAL/PLATELET - Abnormal; Notable for the following:    RBC 6.45 (*)    MCV 73.4 (*)    MCH 24.5 (*)    RDW 16.1 (*)    All other components within normal limits  CARBAMAZEPINE LEVEL, TOTAL - Abnormal; Notable for the following:    Carbamazepine Lvl <2.0 (*)    All other components within normal limits  LEVETIRACETAM LEVEL   ____________________________________________  EKG   ____________________________________________  RADIOLOGY   ____________________________________________   PROCEDURES  Procedure(s) performed:   Procedures  Critical Care performed:  ____________________________________________   INITIAL IMPRESSION / ASSESSMENT AND PLAN / ED COURSE  Pertinent labs & imaging results that were available during my care of the patient were reviewed by me and considered in my medical decision making (see chart for details).  Discussed with Dr. Clelia Croft. Patient had been switched back to Tegretol. Patient's mail-order pharmacy is not been able to come up with the medicine for him yet. Dr. Clelia Croft said just give him a local prescription for the Tegretol started 200 twice a day for 2  weeks and then he can go up to 400 twice a day. Per Dr. Clelia Croft will give him a prescription for the 200 twice a day for 2 weeks and hopefully his mail-order prescription will come and by then. Patient and his wife both understand he needs to follow-up with his HIV doctor to make sure there is no lessening of the effect of his HIV meds.      ____________________________________________   FINAL CLINICAL IMPRESSION(S) / ED DIAGNOSES  Final diagnoses:  Seizure (HCC)      NEW MEDICATIONS STARTED DURING THIS VISIT:  Discharge Medication List as of 03/09/2016 10:38 AM    START taking these medications   Details  !! carbamazepine (TEGRETOL) 200 MG tablet Take 1 tablet (200 mg total) by mouth 2 (two) times daily., Starting Thu 03/09/2016, Until Fri 03/09/2017, Print     !! - Potential duplicate medications found. Please discuss with provider.       Note:  This document was prepared using Dragon voice recognition software and may include unintentional dictation  errors.    Arnaldo Natal, MD 03/11/16 [phone]

## 2016-03-09 NOTE — Discharge Instructions (Signed)
Please return for any further problems. Start the carbamazepine. Take one pill 200 mg twice a day for 2 weeks and then increase to 400 mg twice a day for 2 weeks. Follow-up with Dr. Sherryll BurgerShah,  the neurologist. Call him for an appointment.  [facility]. Have him keep an eye on the HIV medicines as sometimes the carbamazepine will interact with them and make them less effective.

## 2016-03-09 NOTE — ED Notes (Signed)
cbg from ems 196

## 2016-03-10 LAB — LEVETIRACETAM LEVEL: Levetiracetam Lvl: NOT DETECTED ug/mL (ref 10.0–40.0)

## 2016-03-31 ENCOUNTER — Encounter: Payer: Self-pay | Admitting: Emergency Medicine

## 2016-03-31 ENCOUNTER — Emergency Department
Admission: EM | Admit: 2016-03-31 | Discharge: 2016-03-31 | Disposition: A | Discharge status: Home / Self Care | Payer: Self-pay | Attending: Emergency Medicine | Admitting: Emergency Medicine

## 2016-03-31 ENCOUNTER — Emergency Department: Payer: Self-pay

## 2016-03-31 DIAGNOSIS — I10 Essential (primary) hypertension: Secondary | ICD-10-CM | POA: Insufficient documentation

## 2016-03-31 DIAGNOSIS — M62838 Other muscle spasm: Secondary | ICD-10-CM | POA: Insufficient documentation

## 2016-03-31 DIAGNOSIS — Z21 Asymptomatic human immunodeficiency virus [HIV] infection status: Secondary | ICD-10-CM | POA: Insufficient documentation

## 2016-03-31 DIAGNOSIS — Z79899 Other long term (current) drug therapy: Secondary | ICD-10-CM | POA: Insufficient documentation

## 2016-03-31 DIAGNOSIS — F17210 Nicotine dependence, cigarettes, uncomplicated: Secondary | ICD-10-CM | POA: Insufficient documentation

## 2016-03-31 DIAGNOSIS — M25512 Pain in left shoulder: Secondary | ICD-10-CM

## 2016-03-31 LAB — BASIC METABOLIC PANEL
ANION GAP: 10 (ref 5–15)
BUN: 10 mg/dL (ref 6–20)
CO2: 22 mmol/L (ref 22–32)
Calcium: 8.8 mg/dL — ABNORMAL LOW (ref 8.9–10.3)
Chloride: 106 mmol/L (ref 101–111)
Creatinine, Ser: 1 mg/dL (ref 0.61–1.24)
GFR calc Af Amer: >60 mL/min (ref >60)
GLUCOSE: 150 mg/dL — AB (ref 65–99)
POTASSIUM: 3.3 mmol/L — AB (ref 3.5–5.1)
SODIUM: 138 mmol/L (ref 135–145)

## 2016-03-31 LAB — CBC WITH DIFFERENTIAL/PLATELET
BASOS ABS: 0 10*3/uL (ref 0–0.1)
Basophils Relative: 1 %
EOS ABS: 0.1 10*3/uL (ref 0–0.7)
EOS PCT: 2 %
HCT: 44.8 % (ref 40.0–52.0)
Hemoglobin: 14.7 g/dL (ref 13.0–18.0)
Lymphocytes Relative: 33 %
Lymphs Abs: 1.4 10*3/uL (ref 1.0–3.6)
MCH: 24.2 pg — ABNORMAL LOW (ref 26.0–34.0)
MCHC: 32.9 g/dL (ref 32.0–36.0)
MCV: 73.4 fL — ABNORMAL LOW (ref 80.0–100.0)
Monocytes Absolute: 0.4 10*3/uL (ref 0.2–1.0)
Monocytes Relative: 8 %
Neutro Abs: 2.5 10*3/uL (ref 1.4–6.5)
Neutrophils Relative %: 56 %
PLATELETS: 244 10*3/uL (ref 150–440)
RBC: 6.1 MIL/uL — AB (ref 4.40–5.90)
RDW: 15.9 % — AB (ref 11.5–14.5)
WBC: 4.4 10*3/uL (ref 3.8–10.6)

## 2016-03-31 LAB — TROPONIN I: Troponin I: <0.03 ng/mL (ref <0.03)

## 2016-03-31 MED ORDER — CYCLOBENZAPRINE HCL 10 MG PO TABS: 10.0000 mg | ORAL_TABLET | Freq: Three times a day (TID) | ORAL | 0 refills | Status: DC | PRN

## 2016-03-31 MED ORDER — IBUPROFEN 800 MG PO TABS
800.0000 mg | ORAL_TABLET | Freq: Once | ORAL | Status: AC
  Administered 2016-03-31: 800 mg via ORAL
  Filled 2016-03-31: qty 1

## 2016-03-31 MED ORDER — IBUPROFEN 600 MG PO TABS: 600.0000 mg | ORAL_TABLET | Freq: Three times a day (TID) | ORAL | 0 refills | Status: DC | PRN

## 2016-03-31 MED ORDER — CYCLOBENZAPRINE HCL 10 MG PO TABS
10.0000 mg | ORAL_TABLET | Freq: Once | ORAL | Status: AC
  Administered 2016-03-31: 10 mg via ORAL
  Filled 2016-03-31: qty 1

## 2016-03-31 NOTE — ED Triage Notes (Signed)
Pt to ED via POV c/o left shoulder and neck pain since having his last seizure. Pt last seizure was on 03/09/16. Pt denies shortness of breath or chest pain. Pt is able to lift arm but states that pain is worse with movement. Pt does not appear to be in any distress at this time.

## 2016-03-31 NOTE — Discharge Instructions (Signed)
You were evaluated for left shoulder and neck discomfort, and although no certain cause was found, your exam and evaluation are reassuring in the emergency room today. You do have muscle spasm and are being treated with ibuprofen and muscle relaxer Flexeril.  Do not use ibuprofen longer than 1 week or so and you will need to be checked for any kidney injury if any prolonged use.  We also discussed use of a heating pad, and benefit of massage.  Please follow up with your primary care doctor to discuss further treatment options including possible physical therapy and or evaluation of the rotator cuff with MRI or orthopedic consultation.  Return to the emergency department immediately for any worsening condition and Kling chest pain, palpitations, dizziness or passing out, neck pain, headache, vision changes, weakness, numbness, or any other symptoms concerning to you.

## 2016-03-31 NOTE — ED Provider Notes (Signed)
[facility] Emergency Department Provider Note ____________________________________________   I have reviewed the triage vital signs and the triage nursing note.  HISTORY  Chief Complaint Neck Pain and Shoulder Pain   Historian Patient  HPI Varick Keys is a 46 y.o. male with a history of seizures, on carbamazepine twice daily, follows with a primary care doctor and a neurologist. His last seizure was March 1, several weeks ago at this point. After that seizure he did experience left shoulder pain which has persisted since then. He has not had any imaging.  Chest pain or palpitations or trouble breathing. No confusion or altered mental status. No weakness or numbness. Pain over the top of his left neck and shoulder and upper outer shoulder.    Past Medical History:  Diagnosis Date  . HIV (human immunodeficiency virus infection) (HCC)   . Hypertension   . Seizures [facility])     Patient Active Problem List   Diagnosis Date Noted  . Acute encephalopathy 10/19/2015    Past Surgical History:  Procedure Laterality Date  . none      Prior to Admission medications   Medication Sig Start Date End Date Taking? Authorizing Provider  carbamazepine (TEGRETOL) 200 MG tablet Take 2 tablets (400 mg total) by mouth 2 (two) times daily. 10/20/15   Wyatt Haste, MD  carbamazepine (TEGRETOL) 200 MG tablet Take 1 tablet (200 mg total) by mouth 2 (two) times daily. 03/09/16 03/09/17  Arnaldo Natal, MD  cyclobenzaprine (FLEXERIL) 10 MG tablet Take 1 tablet (10 mg total) by mouth 3 (three) times daily as needed for muscle spasms. 03/31/16   Governor Rooks, MD  darunavir-cobicistat (PREZCOBIX) 800-150 MG tablet Take 1 tablet by mouth daily. Swallow whole. Do NOT crush, break or chew tablets. Take with food.    Historical Provider, MD  emtricitabine-tenofovir (TRUVADA) 200-300 MG tablet Take 1 tablet by mouth daily.    Historical Provider, MD  ibuprofen (ADVIL,MOTRIN) 600 MG  tablet Take 1 tablet (600 mg total) by mouth every 8 (eight) hours as needed. 03/31/16   Governor Rooks, MD  levETIRAcetam (KEPPRA) 500 MG tablet Take 1,500 mg by mouth 2 (two) times daily.  11/12/15   Historical Provider, MD  metoprolol succinate (TOPROL-XL) 50 MG 24 hr tablet Take 50 mg by mouth daily. 12/27/15   Historical Provider, MD    Allergies  Allergen Reactions  . Fish Allergy     Family History  Problem Relation Age of Onset  . Hypertension Father     Social History Social History  Substance Use Topics  . Smoking status: Current Every Day Smoker    Packs/day: 1.00    Types: Cigarettes  . Smokeless tobacco: Never Used  . Alcohol use No    Review of Systems  Constitutional: Negative for fever. Eyes: Negative for visual changes. ENT: Negative for sore throat. Cardiovascular: Negative for chest pain. Respiratory: Negative for shortness of breath. Gastrointestinal: Negative for abdominal pain, vomiting and diarrhea. Genitourinary: Negative for dysuria. Musculoskeletal: Negative for back pain.  Positive for left side neck and shoulder pain as per history of present illness. Skin: Negative for rash. Neurological: Negative for headache. 10 point Review of Systems otherwise negative ____________________________________________   PHYSICAL EXAM:  VITAL SIGNS: ED Triage Vitals [03/31/16 1214]  Enc Vitals Group     BP      Pulse      Resp      Temp      Temp src  SpO2      Weight (!) 325 lb (147.4 kg)     Height 6\' 1"  (1.854 m)     Head Circumference      Peak Flow      Pain Score 9     Pain Loc      Pain Edu?      Excl. in GC?      Constitutional: Alert and oriented. Well appearing and in no distress. HEENT   Head: Normocephalic and atraumatic.      Eyes: Conjunctivae are normal. PERRL. Normal extraocular movements.      Ears:         Nose: No congestion/rhinnorhea.   Mouth/Throat: Mucous membranes are moist.   Neck: No stridor.  Moderate  tenderness to palpable muscle spasm along the trapezius up into the posterior neck area. No midline C-spine tenderness to palpation. Cardiovascular/Chest: Normal rate, regular rhythm.  No murmurs, rubs, or gallops. Respiratory: Normal respiratory effort without tachypnea nor retractions. Breath sounds are clear and equal bilaterally. No wheezes/rales/rhonchi. Gastrointestinal: Soft. No distention, no guarding, no rebound. Nontender.    Genitourinary/rectal:Deferred Musculoskeletal: Palpable and tender muscle spasm over the left trapezius muscle across the shoulder and a palpable muscle spasm at the upper outer biceps. Nontender with normal range of motion in all extremities. No joint effusions.  No lower extremity tenderness.  No edema. Neurologic:  Normal speech and language. No gross or focal neurologic deficits are appreciated. Skin:  Skin is warm, dry and intact. No rash noted. Psychiatric: Mood and affect are normal. Speech and behavior are normal. Patient exhibits appropriate insight and judgment.   ____________________________________________  LABS (pertinent positives/negatives)  Labs Reviewed  CBC WITH DIFFERENTIAL/PLATELET - Abnormal; Notable for the following:       Result Value   RBC 6.10 (*)    MCV 73.4 (*)    MCH 24.2 (*)    RDW 15.9 (*)    All other components within normal limits  BASIC METABOLIC PANEL - Abnormal; Notable for the following:    Potassium 3.3 (*)    Glucose, Bld 150 (*)    Calcium 8.8 (*)    All other components within normal limits  TROPONIN I    ____________________________________________    EKG I, Governor Rooks, MD, the attending physician have personally viewed and interpreted all ECGs.  76 bpm. Normal sinus rhythm. Left chest deviation. Nonspecific ST and T-wave ____________________________________________  RADIOLOGY All Xrays were viewed by me. Imaging interpreted by Radiologist.  Left shoulder x-ray:  FINDINGS: There is no evidence of  fracture or dislocation. There is no evidence of arthropathy or other focal bone abnormality. Soft tissues are unremarkable.  IMPRESSION: Negative. __________________________________________  PROCEDURES  Procedure(s) performed: None  Critical Care performed: None  ____________________________________________   ED COURSE / ASSESSMENT AND PLAN  Pertinent labs & imaging results that were available during my care of the patient were reviewed by me and considered in my medical decision making (see chart for details).   His symptoms do not seem consistent with cardiac etiology, or vascular emergency. He clinically has tender palpable muscle spasm of the upper bicep and across the trapezius on the left. He is able to range his shoulder with some discomfort, but no obvious dislocation. I did image, no fractures seen.  Clinically he is going to be treated for muscle spasm, and I have asked him to follow-up with his primary care doctor for further evaluation in terms of possible workup for rotator cuff injury.  CONSULTATIONS:   None   Patient / Family / Caregiver informed of clinical course, medical decision-making process, and agree with plan.   I discussed return precautions, follow-up instructions, and discharge instructions with patient and/or family.  Discharge instructions:  You were evaluated for left shoulder and neck discomfort, and although no certain cause was found, your exam and evaluation are reassuring in the emergency room today. You do have muscle spasm and are being treated with ibuprofen and muscle relaxer Flexeril.  Do not use ibuprofen longer than 1 week or so and you will need to be checked for any kidney injury if any prolonged use.  We also discussed use of a heating pad, and benefit of massage.  Please follow up with your primary care doctor to discuss further treatment options including possible physical therapy and or evaluation of the rotator cuff with MRI  or orthopedic consultation.  Return to the emergency department immediately for any worsening condition and [place] chest pain, palpitations, dizziness or passing out, neck pain, headache, vision changes, weakness, numbness, or any other symptoms concerning to you. ___________________________________________   FINAL CLINICAL IMPRESSION(S) / ED DIAGNOSES   Final diagnoses:  Trapezius muscle spasm  Left shoulder pain, unspecified chronicity              Note: This dictation was prepared with Dragon dictation. Any transcriptional errors that result from this process are unintentional    Governor Rooksebecca Marjo Grosvenor, MD 03/31/16 1354

## 2016-04-27 ENCOUNTER — Emergency Department
Admission: EM | Admit: 2016-04-27 | Discharge: 2016-04-27 | Disposition: A | Discharge status: Home / Self Care | Payer: Self-pay | Attending: Emergency Medicine | Admitting: Emergency Medicine

## 2016-04-27 ENCOUNTER — Emergency Department: Payer: Self-pay

## 2016-04-27 ENCOUNTER — Encounter: Payer: Self-pay | Admitting: *Deleted

## 2016-04-27 DIAGNOSIS — R5383 Other fatigue: Secondary | ICD-10-CM

## 2016-04-27 DIAGNOSIS — Z21 Asymptomatic human immunodeficiency virus [HIV] infection status: Secondary | ICD-10-CM | POA: Insufficient documentation

## 2016-04-27 DIAGNOSIS — F17210 Nicotine dependence, cigarettes, uncomplicated: Secondary | ICD-10-CM | POA: Insufficient documentation

## 2016-04-27 DIAGNOSIS — I10 Essential (primary) hypertension: Secondary | ICD-10-CM | POA: Insufficient documentation

## 2016-04-27 DIAGNOSIS — Z79899 Other long term (current) drug therapy: Secondary | ICD-10-CM | POA: Insufficient documentation

## 2016-04-27 DIAGNOSIS — N390 Urinary tract infection, site not specified: Secondary | ICD-10-CM | POA: Insufficient documentation

## 2016-04-27 DIAGNOSIS — Z791 Long term (current) use of non-steroidal anti-inflammatories (NSAID): Secondary | ICD-10-CM | POA: Insufficient documentation

## 2016-04-27 LAB — URINALYSIS, COMPLETE (UACMP) WITH MICROSCOPIC
Bacteria, UA: NONE SEEN
Bilirubin Urine: NEGATIVE
GLUCOSE, UA: NEGATIVE mg/dL
KETONES UR: NEGATIVE mg/dL
Leukocytes, UA: NEGATIVE
Nitrite: NEGATIVE
PH: 6 (ref 5.0–8.0)
PROTEIN: NEGATIVE mg/dL
Specific Gravity, Urine: 1.02 (ref 1.005–1.030)

## 2016-04-27 LAB — COMPREHENSIVE METABOLIC PANEL
ALT: 59 U/L (ref 17–63)
AST: 39 U/L (ref 15–41)
Albumin: 4.4 g/dL (ref 3.5–5.0)
Alkaline Phosphatase: 71 U/L (ref 38–126)
Anion gap: 6 (ref 5–15)
BUN: 8 mg/dL (ref 6–20)
CHLORIDE: 108 mmol/L (ref 101–111)
CO2: 24 mmol/L (ref 22–32)
CREATININE: 0.95 mg/dL (ref 0.61–1.24)
Calcium: 9.2 mg/dL (ref 8.9–10.3)
GFR calc Af Amer: >60 mL/min (ref >60)
Glucose, Bld: 105 mg/dL — ABNORMAL HIGH (ref 65–99)
POTASSIUM: 3.6 mmol/L (ref 3.5–5.1)
SODIUM: 138 mmol/L (ref 135–145)
Total Bilirubin: 0.7 mg/dL (ref 0.3–1.2)
Total Protein: 8.5 g/dL — ABNORMAL HIGH (ref 6.5–8.1)

## 2016-04-27 LAB — CBC
HEMATOCRIT: 47.8 % (ref 40.0–52.0)
Hemoglobin: 15.4 g/dL (ref 13.0–18.0)
MCH: 23.8 pg — AB (ref 26.0–34.0)
MCHC: 32.1 g/dL (ref 32.0–36.0)
MCV: 74 fL — AB (ref 80.0–100.0)
PLATELETS: 177 10*3/uL (ref 150–440)
RBC: 6.46 MIL/uL — ABNORMAL HIGH (ref 4.40–5.90)
RDW: 15.8 % — AB (ref 11.5–14.5)
WBC: 4.6 10*3/uL (ref 3.8–10.6)

## 2016-04-27 LAB — URINE DRUG SCREEN, QUALITATIVE (ARMC ONLY)
AMPHETAMINES, UR SCREEN: NOT DETECTED
BENZODIAZEPINE, UR SCRN: NOT DETECTED
Barbiturates, Ur Screen: NOT DETECTED
CANNABINOID 50 NG, UR ~~LOC~~: POSITIVE — AB
Cocaine Metabolite,Ur ~~LOC~~: NOT DETECTED
MDMA (Ecstasy)Ur Screen: NOT DETECTED
Methadone Scn, Ur: NOT DETECTED
OPIATE, UR SCREEN: NOT DETECTED
PHENCYCLIDINE (PCP) UR S: NOT DETECTED
Tricyclic, Ur Screen: NOT DETECTED

## 2016-04-27 MED ORDER — METOPROLOL SUCCINATE ER 50 MG PO TB24
50.0000 mg | ORAL_TABLET | Freq: Every day | ORAL | Status: DC
  Administered 2016-04-27: 50 mg via ORAL
  Filled 2016-04-27: qty 1

## 2016-04-27 MED ORDER — METOPROLOL SUCCINATE ER 50 MG PO TB24: 50.0000 mg | ORAL_TABLET | Freq: Every day | ORAL | 0 refills | Status: AC

## 2016-04-27 MED ORDER — SULFAMETHOXAZOLE-TRIMETHOPRIM 800-160 MG PO TABS: 1.0000 | ORAL_TABLET | Freq: Two times a day (BID) | ORAL | 0 refills | Status: DC

## 2016-04-27 NOTE — ED Provider Notes (Signed)
[facility] Emergency Department Provider Note  ____________________________________________  Time seen: Approximately 7:20 PM  I have reviewed the triage vital signs and the nursing notes.   HISTORY  Chief Complaint Altered Mental Status    HPI Victor Scott is a 46 y.o. male presents for evaluation of increased sleepiness and seeming confused. Spouse called his neurology clinic, who reported that if he is confused he should go to the ED for assessment. Patient denies any pain denies any acute complaints. Reports compliance with his medication. Reports eating and drinking normally. Denies Trauma.  Patient has a history of seizures and is due to see his neurologist next week. Patient has not had any observed seizure activity which is described as generalized tonic-clonic by his wife. Patient denies any seizures. Denies any acute headaches.     Past Medical History:  Diagnosis Date  . HIV (human immunodeficiency virus infection) (HCC)   . Hypertension   . Seizures [facility])      Patient Active Problem List   Diagnosis Date Noted  . Acute encephalopathy 10/19/2015     Past Surgical History:  Procedure Laterality Date  . none       Prior to Admission medications   Medication Sig Start Date End Date Taking? Authorizing Provider  carbamazepine (TEGRETOL) 200 MG tablet Take 2 tablets (400 mg total) by mouth 2 (two) times daily. 10/20/15   Wyatt Haste, MD  carbamazepine (TEGRETOL) 200 MG tablet Take 1 tablet (200 mg total) by mouth 2 (two) times daily. 03/09/16 03/09/17  Arnaldo Natal, MD  cyclobenzaprine (FLEXERIL) 10 MG tablet Take 1 tablet (10 mg total) by mouth 3 (three) times daily as needed for muscle spasms. 03/31/16   Governor Rooks, MD  darunavir-cobicistat (PREZCOBIX) 800-150 MG tablet Take 1 tablet by mouth daily. Swallow whole. Do NOT crush, break or chew tablets. Take with food.    Historical Provider, MD  emtricitabine-tenofovir (TRUVADA)  200-300 MG tablet Take 1 tablet by mouth daily.    Historical Provider, MD  ibuprofen (ADVIL,MOTRIN) 600 MG tablet Take 1 tablet (600 mg total) by mouth every 8 (eight) hours as needed. 03/31/16   Governor Rooks, MD  levETIRAcetam (KEPPRA) 500 MG tablet Take 1,500 mg by mouth 2 (two) times daily.  11/12/15   Historical Provider, MD  metoprolol succinate (TOPROL-XL) 50 MG 24 hr tablet Take 1 tablet (50 mg total) by mouth daily. 04/27/16   Sharman Cheek, MD  sulfamethoxazole-trimethoprim (BACTRIM DS) 800-160 MG tablet Take 1 tablet by mouth 2 (two) times daily. 04/27/16   Sharman Cheek, MD     Allergies Fish allergy   Family History  Problem Relation Age of Onset  . Hypertension Father     Social History Social History  Substance Use Topics  . Smoking status: Current Every Day Smoker    Packs/day: 1.00    Types: Cigarettes  . Smokeless tobacco: Never Used  . Alcohol use No    Review of Systems  Constitutional:   No fever or chills.  ENT:   No sore throat. No rhinorrhea. Cardiovascular:   No chest pain. Respiratory:   No dyspnea or cough. Gastrointestinal:   Negative for abdominal pain, vomiting and diarrhea.  Genitourinary:   Negative for dysuria or difficulty urinating. Musculoskeletal:   Negative for focal pain or swelling Neurological:   Negative for headaches 10-point ROS otherwise negative.  ____________________________________________   PHYSICAL EXAM:  VITAL SIGNS: ED Triage Vitals  Enc Vitals Group     BP 04/27/16  1558 (!) 155/101     Pulse Rate 04/27/16 1558 73     Resp 04/27/16 1558 18     Temp 04/27/16 1558 98.3 F (36.8 C)     Temp Source 04/27/16 1558 Oral     SpO2 04/27/16 1558 98 %     Weight 04/27/16 1555 (!) 320 lb (145.2 kg)     Height 04/27/16 1555  (1.854 m)     Head Circumference --      Peak Flow --      Pain Score 04/27/16 1554 0     Pain Loc --      Pain Edu? --      Excl. in GC? --     Vital signs reviewed, nursing assessments  reviewed.   Constitutional:   Alert and oriented. Well appearing and in no distress. Eyes:   No scleral icterus. No conjunctival pallor. PERRL. EOMI.  No nystagmus. ENT   Head:   Normocephalic and atraumatic.   Nose:   No congestion/rhinnorhea. No septal hematoma   Mouth/Throat:   MMM, no pharyngeal erythema. No peritonsillar mass.    Neck:   No stridor. No SubQ emphysema. No meningismus. Hematological/Lymphatic/Immunilogical:   No cervical lymphadenopathy. Cardiovascular:   RRR. Symmetric bilateral radial and DP pulses.  No murmurs.  Respiratory:   Normal respiratory effort without tachypnea nor retractions. Breath sounds are clear and equal bilaterally. No wheezes/rales/rhonchi. Gastrointestinal:   Soft and nontender. Non distended. There is no CVA tenderness.  No rebound, rigidity, or guarding. Genitourinary:   deferred Musculoskeletal:   Normal range of motion in all extremities. No joint effusions.  No lower extremity tenderness.  No edema. Neurologic:   Normal speech and language.  CN 2-10 normal. Motor grossly intact. No gross focal neurologic deficits are appreciated.  Skin:    Skin is warm, dry and intact. No rash noted.  No petechiae, purpura, or bullae.  ____________________________________________    LABS (pertinent positives/negatives) (all labs ordered are listed, but only abnormal results are displayed) Labs Reviewed  COMPREHENSIVE METABOLIC PANEL - Abnormal; Notable for the following:       Result Value   Glucose, Bld 105 (*)    Total Protein 8.5 (*)    All other components within normal limits  CBC - Abnormal; Notable for the following:    RBC 6.46 (*)    MCV 74.0 (*)    MCH 23.8 (*)    RDW 15.8 (*)    All other components within normal limits  URINE DRUG SCREEN, QUALITATIVE ([facility] ONLY) - Abnormal; Notable for the following:    Cannabinoid 50 Ng, Ur [place] POSITIVE (*)    All other components within normal limits  URINALYSIS, COMPLETE (UACMP) WITH  MICROSCOPIC - Abnormal; Notable for the following:    Color, Urine YELLOW (*)    APPearance HAZY (*)    Hgb urine dipstick MODERATE (*)    Squamous Epithelial / LPF 0-5 (*)    All other components within normal limits  URINE CULTURE  CBG MONITORING, ED   ____________________________________________   EKG    ____________________________________________    RADIOLOGY  Ct Head Wo Contrast  Result Date: 04/27/2016 CLINICAL DATA:  Acute onset of altered mental status and confusion. Personal history of seizures. Initial encounter. EXAM: CT HEAD WITHOUT CONTRAST TECHNIQUE: Contiguous axial images were obtained from the base of the skull through the vertex without intravenous contrast. COMPARISON:  CT of the head performed 10/19/2015, and MRI of the brain performed 10/20/2015 FINDINGS:  Brain: No evidence of acute infarction, hemorrhage, hydrocephalus, extra-axial collection or mass lesion/mass effect. The posterior fossa, including the cerebellum, brainstem and fourth ventricle, is within normal limits. The third and lateral ventricles, and basal ganglia are unremarkable in appearance. The cerebral hemispheres are symmetric in appearance, with normal gray-white differentiation. No mass effect or midline shift is seen. Vascular: No hyperdense vessel or unexpected calcification. Skull: There is no evidence of fracture; visualized osseous structures are unremarkable in appearance. Sinuses/Orbits: The visualized portions of the orbits are within normal limits. The paranasal sinuses and mastoid air cells are well-aerated. Other: No significant soft tissue abnormalities are seen. IMPRESSION: Unremarkable noncontrast CT of the head. Electronically Signed   By: Roanna Raider M.D.   On: 04/27/2016 18:26    ____________________________________________   PROCEDURES Procedures  ____________________________________________   INITIAL IMPRESSION / ASSESSMENT AND PLAN / ED COURSE  Pertinent labs &  imaging results that were available during my care of the patient were reviewed by me and considered in my medical decision making (see chart for details).  Patient well appearing no acute distress. Denies any symptoms. Brought to the ED due to sleeping more. Lab survey unremarkable. Urinalysis suggestive of a UTI so we'll treat him with Bactrim. CT head negative.Considering the patient's symptoms, medical history, and physical examination today, I have low suspicion for ischemic stroke, intracranial hemorrhage, meningitis, encephalitis, carotid or vertebral dissection, venous sinus thrombosis, MS, intracranial hypertension, glaucoma, CRAO, CRVO, or temporal arteritis.  Patient ambulatory and energetic in the ED. May also be an element of intoxication due to cannabis use. Discharged to follow-up with primary care and neurology.        ____________________________________________   FINAL CLINICAL IMPRESSION(S) / ED DIAGNOSES  Final diagnoses:  Fatigue, unspecified type  Lower urinary tract infectious disease      New Prescriptions   SULFAMETHOXAZOLE-TRIMETHOPRIM (BACTRIM DS) 800-160 MG TABLET    Take 1 tablet by mouth 2 (two) times daily.     Portions of this note were generated with dragon dictation software. Dictation errors may occur despite best attempts at proofreading.    Sharman Cheek, MD 04/27/16 [phone]

## 2016-04-27 NOTE — Discharge Instructions (Signed)
Your CT scan of the head was unremarkable today.

## 2016-04-27 NOTE — ED Notes (Signed)
Dr. Scotty Court made aware of patients AMS.

## 2016-04-27 NOTE — ED Triage Notes (Addendum)
Per pt sent by neurology for increased AMS and confusion since yesterday, per wife hx of seizures, last seizure 3/1, states pt is sleeping a lot and flat affect which is not his normal, states she is unsure if he is taking his medication as prescribed, when asked why he is here pt states "I just am, I dont know", denies any pain, pt alert x4

## 2016-04-29 LAB — URINE CULTURE: CULTURE: NO GROWTH

## 2016-07-19 ENCOUNTER — Emergency Department
Admission: EM | Admit: 2016-07-19 | Discharge: 2016-07-20 | Disposition: A | Discharge status: Other Acute Inpatient Hospital | Payer: Self-pay | Attending: Emergency Medicine | Admitting: Emergency Medicine

## 2016-07-19 ENCOUNTER — Other Ambulatory Visit: Payer: Self-pay

## 2016-07-19 ENCOUNTER — Encounter: Payer: Self-pay | Admitting: Emergency Medicine

## 2016-07-19 DIAGNOSIS — T50992A Poisoning by other drugs, medicaments and biological substances, intentional self-harm, initial encounter: Secondary | ICD-10-CM | POA: Insufficient documentation

## 2016-07-19 DIAGNOSIS — I10 Essential (primary) hypertension: Secondary | ICD-10-CM | POA: Insufficient documentation

## 2016-07-19 DIAGNOSIS — R569 Unspecified convulsions: Secondary | ICD-10-CM | POA: Insufficient documentation

## 2016-07-19 DIAGNOSIS — R45851 Suicidal ideations: Secondary | ICD-10-CM | POA: Insufficient documentation

## 2016-07-19 DIAGNOSIS — F17210 Nicotine dependence, cigarettes, uncomplicated: Secondary | ICD-10-CM | POA: Insufficient documentation

## 2016-07-19 DIAGNOSIS — T447X2A Poisoning by beta-adrenoreceptor antagonists, intentional self-harm, initial encounter: Secondary | ICD-10-CM | POA: Insufficient documentation

## 2016-07-19 DIAGNOSIS — T50902A Poisoning by unspecified drugs, medicaments and biological substances, intentional self-harm, initial encounter: Secondary | ICD-10-CM

## 2016-07-19 DIAGNOSIS — B20 Human immunodeficiency virus [HIV] disease: Secondary | ICD-10-CM | POA: Insufficient documentation

## 2016-07-19 DIAGNOSIS — F322 Major depressive disorder, single episode, severe without psychotic features: Secondary | ICD-10-CM | POA: Insufficient documentation

## 2016-07-19 LAB — URINE DRUG SCREEN, QUALITATIVE (ARMC ONLY)
Amphetamines, Ur Screen: NOT DETECTED
BARBITURATES, UR SCREEN: NOT DETECTED
BENZODIAZEPINE, UR SCRN: NOT DETECTED
CANNABINOID 50 NG, UR ~~LOC~~: POSITIVE — AB
Cocaine Metabolite,Ur ~~LOC~~: NOT DETECTED
MDMA (Ecstasy)Ur Screen: NOT DETECTED
METHADONE SCREEN, URINE: NOT DETECTED
OPIATE, UR SCREEN: NOT DETECTED
PHENCYCLIDINE (PCP) UR S: NOT DETECTED
Tricyclic, Ur Screen: NOT DETECTED

## 2016-07-19 LAB — CBC
HCT: 50.1 % (ref 40.0–52.0)
HEMOGLOBIN: 16 g/dL (ref 13.0–18.0)
MCH: 24.2 pg — ABNORMAL LOW (ref 26.0–34.0)
MCHC: 31.9 g/dL — ABNORMAL LOW (ref 32.0–36.0)
MCV: 75.8 fL — ABNORMAL LOW (ref 80.0–100.0)
PLATELETS: 214 10*3/uL (ref 150–440)
RBC: 6.61 MIL/uL — AB (ref 4.40–5.90)
RDW: 15.7 % — ABNORMAL HIGH (ref 11.5–14.5)
WBC: 4.6 10*3/uL (ref 3.8–10.6)

## 2016-07-19 LAB — COMPREHENSIVE METABOLIC PANEL
ALT: 47 U/L (ref 17–63)
AST: 34 U/L (ref 15–41)
Albumin: 4.6 g/dL (ref 3.5–5.0)
Alkaline Phosphatase: 70 U/L (ref 38–126)
Anion gap: 11 (ref 5–15)
BUN: 8 mg/dL (ref 6–20)
CHLORIDE: 107 mmol/L (ref 101–111)
CO2: 21 mmol/L — ABNORMAL LOW (ref 22–32)
CREATININE: 0.99 mg/dL (ref 0.61–1.24)
Calcium: 9.3 mg/dL (ref 8.9–10.3)
GFR calc non Af Amer: >60 mL/min (ref >60)
Glucose, Bld: 107 mg/dL — ABNORMAL HIGH (ref 65–99)
POTASSIUM: 3.3 mmol/L — AB (ref 3.5–5.1)
Sodium: 139 mmol/L (ref 135–145)
Total Bilirubin: 1 mg/dL (ref 0.3–1.2)
Total Protein: 8.7 g/dL — ABNORMAL HIGH (ref 6.5–8.1)

## 2016-07-19 LAB — ETHANOL: Alcohol, Ethyl (B): <5 mg/dL (ref <5)

## 2016-07-19 LAB — ACETAMINOPHEN LEVEL: Acetaminophen (Tylenol), Serum: <10 ug/mL — ABNORMAL LOW (ref 10–30)

## 2016-07-19 LAB — SALICYLATE LEVEL

## 2016-07-19 LAB — CARBAMAZEPINE LEVEL, TOTAL: Carbamazepine Lvl: 2 ug/mL — ABNORMAL LOW (ref 4.0–12.0)

## 2016-07-19 MED ORDER — DIPHENHYDRAMINE HCL 50 MG/ML IJ SOLN: 50.0000 mg | Freq: Once | INTRAMUSCULAR | Status: DC

## 2016-07-19 MED ORDER — HALOPERIDOL LACTATE 5 MG/ML IJ SOLN
INTRAMUSCULAR | Status: AC
  Filled 2016-07-19: qty 1

## 2016-07-19 MED ORDER — HALOPERIDOL LACTATE 5 MG/ML IJ SOLN: 5.0000 mg | Freq: Once | INTRAMUSCULAR | Status: DC

## 2016-07-19 MED ORDER — EMTRICITABINE-TENOFOVIR AF 200-25 MG PO TABS
1.0000 | ORAL_TABLET | Freq: Every day | ORAL | Status: DC
  Administered 2016-07-19 – 2016-07-20 (×2): 1 via ORAL
  Filled 2016-07-19 (×3): qty 1

## 2016-07-19 MED ORDER — LEVETIRACETAM 500 MG PO TABS
1500.0000 mg | ORAL_TABLET | Freq: Two times a day (BID) | ORAL | Status: DC
  Filled 2016-07-19 (×2): qty 3

## 2016-07-19 MED ORDER — LORAZEPAM 2 MG/ML IJ SOLN: 2.0000 mg | Freq: Once | INTRAMUSCULAR | Status: DC

## 2016-07-19 MED ORDER — SODIUM CHLORIDE 0.9 % IV BOLUS (SEPSIS)
1000.0000 mL | Freq: Once | INTRAVENOUS | Status: AC
  Administered 2016-07-19: 1000 mL via INTRAVENOUS

## 2016-07-19 MED ORDER — LORAZEPAM 2 MG/ML IJ SOLN
INTRAMUSCULAR | Status: AC
  Filled 2016-07-19: qty 1

## 2016-07-19 MED ORDER — CARBAMAZEPINE 200 MG PO TABS
400.0000 mg | ORAL_TABLET | Freq: Two times a day (BID) | ORAL | Status: DC
  Administered 2016-07-19 – 2016-07-20 (×2): 400 mg via ORAL
  Filled 2016-07-19 (×2): qty 2

## 2016-07-19 MED ORDER — DIPHENHYDRAMINE HCL 50 MG/ML IJ SOLN
INTRAMUSCULAR | Status: AC
  Filled 2016-07-19: qty 1

## 2016-07-19 MED ORDER — DARUNAVIR-COBICISTAT 800-150 MG PO TABS
1.0000 | ORAL_TABLET | Freq: Every day | ORAL | Status: DC
  Filled 2016-07-19: qty 1

## 2016-07-19 NOTE — ED Notes (Signed)
[place] sent  

## 2016-07-19 NOTE — ED Provider Notes (Addendum)
[facility] Emergency Department Provider Note  ____________________________________________  Time seen: Approximately 5:11 PM  I have reviewed the triage vital signs and the nursing notes.   HISTORY  Chief Complaint Ingestion    HPI Victor Scott is a 46 y.o. male with a history of HIV, HTN, seizure disorder presenting for overdose. The patient states that he was tried to kill himself when he took 60 tablets of 50mg  metoprolol at 3:30pm today. "There ain't no use in being here anymore."  He did tell a friend about his overdose.  He denies any other coingestions and has no symptoms at this time.  No abd pain, n/v.   Past Medical History:  Diagnosis Date  . HIV (human immunodeficiency virus infection) (HCC)   . Hypertension   . Seizures [facility])     Patient Active Problem List   Diagnosis Date Noted  . Suicide attempt by beta blocker overdose (HCC) 07/19/2016  . Severe major depression, single episode (HCC) 07/19/2016  . HIV (human immunodeficiency virus infection) (HCC) 07/19/2016  . Seizures (HCC) 07/19/2016  . Acute encephalopathy 10/19/2015    Past Surgical History:  Procedure Laterality Date  . none      Current Outpatient Rx  . Order #: [id number] Class: Historical Med  . Order #: [id number] Class: Historical Med  . Order #: [id number] Class: Normal  . Order #: [id number] Class: Print  . Order #: [id number] Class: Print  . Order #: [id number] Class: Historical Med  . Order #: [id number] Class: Historical Med  . Order #: [id number] Class: Print  . Order #: [id number] Class: Historical Med  . Order #: [id number] Class: Print  . Order #: [id number] Class: Print    Allergies Fish allergy  Family History  Problem Relation Age of Onset  . Hypertension Father     Social History Social History  Substance Use Topics  . Smoking status: Current Every Day Smoker    Packs/day: 1.00    Types: Cigarettes  . Smokeless tobacco: Never Used  . Alcohol use No     Review of Systems Constitutional: No fever/chills. No lightheadedness or syncope Eyes: No visual changes. ENT: No sore throat. No congestion or rhinorrhea. Cardiovascular: Denies chest pain. Denies palpitations. Respiratory: Denies shortness of breath.  No cough. Gastrointestinal: No abdominal pain.  No nausea, no vomiting.  No diarrhea.  No constipation. Genitourinary: Negative for dysuria. Musculoskeletal: Negative for back pain. Skin: Negative for rash. Neurological: Negative for headaches. No focal numbness, tingling or weakness.  Psychiatric:+ SI w/ suicide attempt, no HI, no hallucinations   ____________________________________________   PHYSICAL EXAM:  VITAL SIGNS: ED Triage Vitals  Enc Vitals Group     BP 07/19/16 1620 128/86     Pulse Rate 07/19/16 1620 80     Resp 07/19/16 1620 17     Temp 07/19/16 1620 98.4 F (36.9 C)     Temp Source 07/19/16 1620 Oral     SpO2 07/19/16 1620 98 %     Weight 07/19/16 1621 (!) 330 lb (149.7 kg)     Height 07/19/16 1621 6\' 1"  (1.854 m)     Head Circumference --      Peak Flow --      Pain Score --      Pain Loc --      Pain Edu? --      Excl. in GC? --     Constitutional: Alert and oriented. Well appearing and in no acute distress. Resting comfortably on the stretcher with clear speech.  Answers questions appropriately. Eyes: Conjunctivae  are normal.  EOMI. No scleral icterus. Head: Atraumatic. Nose: No congestion/rhinnorhea. Mouth/Throat: Mucous membranes are moist.  Neck: No stridor.  Supple.  No JVD. No meningismus. Cardiovascular: Normal rate, regular rhythm. No murmurs, rubs or gallops.  Respiratory: Normal respiratory effort.  No accessory muscle use or retractions. Lungs CTAB.  No wheezes, rales or ronchi. Gastrointestinal: Obese. Soft, nontender and nondistended.  No guarding or rebound.  No peritoneal signs. Musculoskeletal: No LE edema.  Neurologic:  A&Ox3.  Speech is clear.  Face and smile are symmetric.   EOMI.  Moves all extremities well. Skin:  Skin is warm, dry and intact. No rash noted. Psychiatric: The patient does endorse suicidality on my examination. He denies homicidality or hallucinations. Flat affect with depressed mood.  ____________________________________________   LABS (all labs ordered are listed, but only abnormal results are displayed)  Labs Reviewed  COMPREHENSIVE METABOLIC PANEL - Abnormal; Notable for the following:       Result Value   Potassium 3.3 (*)    CO2 21 (*)    Glucose, Bld 107 (*)    Total Protein 8.7 (*)    All other components within normal limits  ACETAMINOPHEN LEVEL - Abnormal; Notable for the following:    Acetaminophen (Tylenol), Serum <10 (*)    All other components within normal limits  CBC - Abnormal; Notable for the following:    RBC 6.61 (*)    MCV 75.8 (*)    MCH 24.2 (*)    MCHC 31.9 (*)    RDW 15.7 (*)    All other components within normal limits  ETHANOL  SALICYLATE LEVEL  URINE DRUG SCREEN, QUALITATIVE ([facility] ONLY)  ACETAMINOPHEN LEVEL  CARBAMAZEPINE LEVEL, TOTAL  LEVETIRACETAM LEVEL   ____________________________________________  EKG   ED ECG REPORT I, Rockne Menghini, the attending physician, personally viewed and interpreted this ECG.   Date: 07/19/2016  EKG Time: 1611  Rate: 80  Rhythm: normal sinus rhythm; incomplete RBBB  Axis: leftward  Intervals:none  ST&T Change: No STEMI  Repeat EKG ED ECG REPORT I, Rockne Menghini, the attending physician, personally viewed and interpreted this ECG.   Date: 07/19/2016  EKG Time: 1936  Rate: 60  Rhythm: normal sinus rhythm  Axis: leftward  Intervals:none  ST&T Change: No STEMI  ____________________________________________  RADIOLOGY  No results found.  ____________________________________________   PROCEDURES  Procedure(s) performed: None  Procedures  Critical Care performed: No ____________________________________________   INITIAL  IMPRESSION / ASSESSMENT AND PLAN / ED COURSE  Pertinent labs & imaging results that were available during my care of the patient were reviewed by me and considered in my medical decision making (see chart for details).  46 y.o. male brought to the emergency department for overdose with 60 tablets of 50 mg metoprolol. Overall, the patient is a symptomatic at this time, greater than 1.5 hours after ingestion. We'll continue to monitor him closely, with cardiac monitoring and every 15 minute vital sign checks. The remainder of his laboratory studies so far reassuring, but we will get a 4 hour acetaminophen level for reevaluation. The patient will require significant monitoring, if he does not develop symptoms, it is possible that he did not have a true ingestion. I've spoken to [facility] poison control, who recommend continued monitoring, and will send a algorithm if the patient becomes symptomatic. There is no treatment at this time area did give the patient intravenous fluids, keep him nothing by mouth. He is too far from ingestion for charcoal.  Plan reevaluation for final  disposition.  ----------------------------------------- 6:10 PM on 07/19/2016 -----------------------------------------  I reevaluated the patient who is sleeping at this time. His heart rate is now in the 60s but when he awakes and talks to me he goes to the low 70s. His repeat blood pressure was 123. Both of these are a downward trend from arrival, it is possible that this is because he is sleeping, or may be the initial signs that he did in fact ingest a significant amount of metoprolol. I have let the charge nurse know and we will make sure that all the proper equipment is in the room if the patient's clinical course were to deteriorate.  ----------------------------------------- 8:50 PM on 07/19/2016 -----------------------------------------  Throughout the patient's stay, he has continued to have stable blood pressure and  heart rates. He has no evidence of decompensation and will be continued to be monitored but is almost 6 hours from ingestion without any evidence of toxicity. At this time he is medically cleared.Verlon Au. We're awaiting psychiatric disposition. ____________________________________________  FINAL CLINICAL IMPRESSION(S) / ED DIAGNOSES  Final diagnoses:  Medication overdose, intentional self-harm, initial encounter (HCC)  Suicidal ideations         NEW MEDICATIONS STARTED DURING THIS VISIT:  New Prescriptions   No medications on file      Rockne MenghiniNorman, Anne-Caroline, MD 07/19/16 1721    Rockne MenghiniNorman, Anne-Caroline, MD 07/19/16 1722    Rockne MenghiniNorman, Anne-Caroline, MD 07/19/16 2051    Rockne MenghiniNorman, Anne-Caroline, MD 07/19/16 2054

## 2016-07-19 NOTE — Consult Note (Signed)
[facility] Consult   Reason for Consult:  Consult for 46 year old male brought to the hospital after taking an intentional overdose of blood pressure medicine Referring Physician:  Mariea Clonts Patient Identification: Victor Scott MRN:  [id number] Principal Diagnosis: Suicide attempt by beta blocker overdose [facility]) Diagnosis:   Patient Active Problem List   Diagnosis Date Noted  . Suicide attempt by beta blocker overdose ([place]) [T44.7X2A] 07/19/2016  . Severe major depression, single episode ([place]) [F32.2] 07/19/2016  . HIV (human immunodeficiency virus infection) ([place]) [B20] 07/19/2016  . Seizures ([place]) [R56.9] 07/19/2016  . Acute encephalopathy [G93.40] 10/19/2015    Total Time spent with patient: 1 hour  Subjective:   Victor Scott is a 46 y.o. male patient admitted with "I took some pills".  HPI:  Patient interviewed chart reviewed. 46 year old man brought to the hospital after being picked up by EMS for suicide attempt. Patient says that he was in downtown [place] today talking on his phone. He took his entire bottle of metoprolol pills. He had been talking with a friend on the phone at that time which is how someone knew that he needed help. Patient says that he is been feeling bad and depressed for a couple of months. He and his wife are fighting constantly. His sleep is interrupted and poor. Energy poor. Appetite poor. Admits that he's been thinking about suicide for a while. Denies that he is drinking he admits that he uses marijuana regularly. Patient has HIV and a seizure disorder. He says he is compliant with his medication and that his seizures have been under reasonably good control. Denies any hallucinations or psychotic symptoms. Patient presents as withdrawn and flat but cooperative.  Social history: Patient lives with his wife. The 2 of them of only been together a couple of years. Judging from the notes in the chart it sounds like he's been out of work  for quite a while because of his medical problems and that this is been a real frustration for him.  Medical history: HIV-positive. Has been living with that for about 8 years. He says he is compliant with his medicine and goes to do for his follow-up treatment. Also has partial complex seizures which have been a little difficult to control. Sees a neurologist through [facility] and takes combination of Keppra and Tegretol  Substance abuse history: Denies alcohol use. Says he uses marijuana regularly denies other drug use. No evidence of any treatment in the past  Past Psychiatric History: Patient denies any past psychiatric hospitalization. Denies any suicide attempts. Has not seen psychiatrists in the past. He says he's only been feeling bad this time for a few months. There is nothing in the chart about past psychiatric condition none  Risk to Self: Is patient at risk for suicide?: Yes Risk to Others:   Prior Inpatient Therapy:   Prior Outpatient Therapy:    Past Medical History:  Past Medical History:  Diagnosis Date  . HIV (human immunodeficiency virus infection) ([place])   . Hypertension   . Seizures ([place])     Past Surgical History:  Procedure Laterality Date  . none     Family History:  Family History  Problem Relation Age of Onset  . Hypertension Father    Family Psychiatric  History: None known Social History:  History  Alcohol Use No     History  Drug Use  . Types: Marijuana    Comment: denies [id number]    Social History   Social History  . Marital  status: Married    Spouse name: N/A  . Number of children: N/A  . Years of education: N/A   Social History Main Topics  . Smoking status: Current Every Day Smoker    Packs/day: 1.00    Types: Cigarettes  . Smokeless tobacco: Never Used  . Alcohol use No  . Drug use: Yes    Types: Marijuana     Comment: denies [id number]  . Sexual activity: Not Asked   Other Topics Concern  . None   Social History Narrative  .  None   Additional Social History:    Allergies:   Allergies  Allergen Reactions  . Fish Allergy     Labs:  Results for orders placed or performed during the hospital encounter of 07/19/16 (from the past 48 hour(s))  Comprehensive metabolic panel     Status: Abnormal   Collection Time: 07/19/16  4:24 PM  Result Value Ref Range   Sodium 139 135 - 145 mmol/L   Potassium 3.3 (L) 3.5 - 5.1 mmol/L   Chloride 107 101 - 111 mmol/L   CO2 21 (L) 22 - 32 mmol/L   Glucose, Bld 107 (H) 65 - 99 mg/dL   BUN 8 6 - 20 mg/dL   Creatinine, Ser 0.99 0.61 - 1.24 mg/dL   Calcium 9.3 8.9 - 10.3 mg/dL   Total Protein 8.7 (H) 6.5 - 8.1 g/dL   Albumin 4.6 3.5 - 5.0 g/dL   AST 34 15 - 41 U/L   ALT 47 17 - 63 U/L   Alkaline Phosphatase 70 38 - 126 U/L   Total Bilirubin 1.0 0.3 - 1.2 mg/dL   GFR calc non Af Amer >60 >60 mL/min   GFR calc Af Amer >60 >60 mL/min    Comment: (NOTE) The eGFR has been calculated using the CKD EPI equation. This calculation has not been validated in all clinical situations. eGFR's persistently <60 mL/min signify possible Chronic Kidney Disease.    Anion gap 11 5 - 15  Ethanol     Status: None   Collection Time: 07/19/16  4:24 PM  Result Value Ref Range   Alcohol, Ethyl (B) <5 <5 mg/dL    Comment:        LOWEST DETECTABLE LIMIT FOR SERUM ALCOHOL IS 5 mg/dL FOR MEDICAL PURPOSES ONLY   Salicylate level     Status: None   Collection Time: 07/19/16  4:24 PM  Result Value Ref Range   Salicylate Lvl <7.7 2.8 - 30.0 mg/dL  Acetaminophen level     Status: Abnormal   Collection Time: 07/19/16  4:24 PM  Result Value Ref Range   Acetaminophen (Tylenol), Serum <10 (L) 10 - 30 ug/mL    Comment:        THERAPEUTIC CONCENTRATIONS VARY SIGNIFICANTLY. A RANGE OF 10-30 ug/mL MAY BE AN EFFECTIVE CONCENTRATION FOR MANY PATIENTS. HOWEVER, SOME ARE BEST TREATED AT CONCENTRATIONS OUTSIDE THIS RANGE. ACETAMINOPHEN CONCENTRATIONS >150 ug/mL AT 4 HOURS AFTER INGESTION AND >50  ug/mL AT 12 HOURS AFTER INGESTION ARE OFTEN ASSOCIATED WITH TOXIC REACTIONS.   cbc     Status: Abnormal   Collection Time: 07/19/16  4:24 PM  Result Value Ref Range   WBC 4.6 3.8 - 10.6 K/uL   RBC 6.61 (H) 4.40 - 5.90 MIL/uL   Hemoglobin 16.0 13.0 - 18.0 g/dL   HCT 50.1 40.0 - 52.0 %   MCV 75.8 (L) 80.0 - 100.0 fL   MCH 24.2 (L) 26.0 - 34.0 pg   MCHC 31.9 (  L) 32.0 - 36.0 g/dL   RDW 15.7 (H) 11.5 - 14.5 %   Platelets 214 150 - 440 K/uL    Current Facility-Administered Medications  Medication Dose Route Frequency Provider Last Rate Last Dose  . carbamazepine (TEGRETOL) tablet 400 mg  400 mg Oral BID AC Clapacs, John T, MD      . Derrill Memo ON 07/20/2016] darunavir-cobicistat (PREZCOBIX) 800-150 MG per tablet 1 tablet  1 tablet Oral Q breakfast Clapacs, John T, MD      . emtricitabine-tenofovir AF (DESCOVY) 200-25 MG per tablet 1 tablet  1 tablet Oral Daily Clapacs, John T, MD      . levETIRAcetam (KEPPRA) tablet 1,500 mg  1,500 mg Oral BID Clapacs, John T, MD      . sodium chloride 0.9 % bolus 1,000 mL  1,000 mL Intravenous Once Eula Listen, MD       Current Outpatient Prescriptions  Medication Sig Dispense Refill  . meloxicam (MOBIC) 15 MG tablet Take 1 tablet by mouth daily.    Marland Kitchen aspirin EC 81 MG tablet Take 81 mg by mouth daily.    . carbamazepine (TEGRETOL) 200 MG tablet Take 2 tablets (400 mg total) by mouth 2 (two) times daily. 60 tablet 0  . carbamazepine (TEGRETOL) 200 MG tablet Take 1 tablet (200 mg total) by mouth 2 (two) times daily. 28 tablet 0  . cyclobenzaprine (FLEXERIL) 10 MG tablet Take 1 tablet (10 mg total) by mouth 3 (three) times daily as needed for muscle spasms. 21 tablet 0  . darunavir-cobicistat (PREZCOBIX) 800-150 MG tablet Take 1 tablet by mouth daily. Swallow whole. Do NOT crush, break or chew tablets. Take with food.    Marland Kitchen emtricitabine-tenofovir (TRUVADA) 200-300 MG tablet Take 1 tablet by mouth daily.    Marland Kitchen ibuprofen (ADVIL,MOTRIN) 600 MG tablet  Take 1 tablet (600 mg total) by mouth every 8 (eight) hours as needed. 20 tablet 0  . levETIRAcetam (KEPPRA) 500 MG tablet Take 1,500 mg by mouth 2 (two) times daily.   3  . metoprolol succinate (TOPROL-XL) 50 MG 24 hr tablet Take 1 tablet (50 mg total) by mouth daily. 15 tablet 0  . sulfamethoxazole-trimethoprim (BACTRIM DS) 800-160 MG tablet Take 1 tablet by mouth 2 (two) times daily. 14 tablet 0    Musculoskeletal: Strength & Muscle Tone: within normal limits Gait & Station: unsteady Patient leans: Backward  Psychiatric Specialty Exam: Physical Exam  Nursing note and vitals reviewed. Constitutional: He appears well-developed and well-nourished.  HENT:  Head: Normocephalic and atraumatic.  Eyes: Conjunctivae are normal. Pupils are equal, round, and reactive to light.  Neck: Normal range of motion.  Cardiovascular: Normal heart sounds.   Respiratory: Effort normal.  GI: Soft.  Musculoskeletal: Normal range of motion.  Neurological: He is alert.  Skin: Skin is warm and dry.  Psychiatric: His affect is blunt. His speech is delayed. He is slowed and withdrawn. Cognition and memory are normal. He expresses impulsivity. He expresses suicidal ideation.    Review of Systems  Constitutional: Positive for malaise/fatigue.  HENT: Negative.   Eyes: Negative.   Respiratory: Negative.   Cardiovascular: Negative.   Gastrointestinal: Negative.   Musculoskeletal: Negative.   Skin: Negative.   Neurological: Positive for weakness.  Psychiatric/Behavioral: Positive for depression and suicidal ideas. Negative for hallucinations, memory loss and substance abuse. The patient has insomnia. The patient is not nervous/anxious.     Blood pressure 128/86, pulse 80, temperature 98.4 F (36.9 C), temperature source Oral, resp. rate 17, height  6' 1"  (1.854 m), weight (!) 149.7 kg (330 lb), SpO2 98 %.Body mass index is 43.54 kg/m.  General Appearance: Casual  Eye Contact:  None  Speech:  Slow  Volume:   Decreased  Mood:  Depressed  Affect:  Constricted and Depressed  Thought Process:  Goal Directed  Orientation:  Full (Time, Place, and Person)  Thought Content:  Logical  Suicidal Thoughts:  Yes.  with intent/plan  Homicidal Thoughts:  No  Memory:  Immediate;   Fair Recent;   Fair Remote;   Fair  Judgement:  Impaired  Insight:  Fair  Psychomotor Activity:  Decreased  Concentration:  Concentration: Fair  Recall:  [employer] of Knowledge:  Fair  Language:  Fair  Akathisia:  No  Handed:  Right  AIMS (if indicated):     Assets:  Housing  ADL's:  Intact  Cognition:  Impaired,  Mild  Sleep:        Treatment Plan Summary: Daily contact with patient to assess and evaluate symptoms and progress in treatment, Medication management and Plan 46 year old man without a past psychiatric history but who describes weeks to months of symptoms of depression. Patient states that he took 60 tablets of extended release metoprolol 50 mg each at around 3:30 today. He indicates that he did this with suicidal intent and has been depressed recently. Patient is being evaluated and worked up by emergency room staff and medical staff. Anticipate that it would be likely that he will need medical stabilization either in the inpatient hospital or emergency room before depressive symptoms can be directly addressed. Continue IVC. I put in orders to restart his seizure medicine and his HIV medicine and get blood levels of his seizure medicine. No reason to start antidepressants yet at this point. I will continue to follow along wherever he is with the understanding that he is likely to need inpatient hospitalization once he is medically cleared.  Disposition: Recommend psychiatric Inpatient admission when medically cleared. Supportive therapy provided about ongoing stressors.  Alethia Berthold, MD 07/19/2016 5:16 PM

## 2016-07-19 NOTE — ED Notes (Signed)
Pharmacy called for medications. 

## 2016-07-19 NOTE — ED Notes (Signed)
Pt requesting to leave, sts that he wants to go see sister in [place].

## 2016-07-19 NOTE — ED Triage Notes (Signed)
Patient presents to ED via [facility] from home. Patient got into an argument with his wife and took an estimated 60 tabs of 50mg  metoprolol extended release in atmept to kill himself. VSS at this time.

## 2016-07-19 NOTE — ED Notes (Signed)
Patient sleeping at this time. Will offer meal tray when awake again.

## 2016-07-19 NOTE — ED Notes (Signed)
Pt requesting food. RN notified and will order supper tray.

## 2016-07-19 NOTE — ED Notes (Signed)
Pt has agreed to stay on cardiac monitor at this time if no medications will be administered

## 2016-07-19 NOTE — ED Notes (Signed)
Patient is IVC and is pending psych consult. 

## 2016-07-20 ENCOUNTER — Inpatient Hospital Stay
Admission: AD | Admit: 2016-07-20 | Discharge: 2016-07-21 | DRG: 882 | Disposition: A | Discharge status: Home / Self Care | Payer: No Typology Code available for payment source | Attending: Psychiatry | Admitting: Psychiatry

## 2016-07-20 DIAGNOSIS — Z8249 Family history of ischemic heart disease and other diseases of the circulatory system: Secondary | ICD-10-CM | POA: Diagnosis not present

## 2016-07-20 DIAGNOSIS — I10 Essential (primary) hypertension: Secondary | ICD-10-CM | POA: Diagnosis present

## 2016-07-20 DIAGNOSIS — Z79899 Other long term (current) drug therapy: Secondary | ICD-10-CM | POA: Diagnosis not present

## 2016-07-20 DIAGNOSIS — R569 Unspecified convulsions: Secondary | ICD-10-CM

## 2016-07-20 DIAGNOSIS — B20 Human immunodeficiency virus [HIV] disease: Secondary | ICD-10-CM | POA: Diagnosis present

## 2016-07-20 DIAGNOSIS — Z7982 Long term (current) use of aspirin: Secondary | ICD-10-CM | POA: Diagnosis not present

## 2016-07-20 DIAGNOSIS — F17210 Nicotine dependence, cigarettes, uncomplicated: Secondary | ICD-10-CM | POA: Diagnosis present

## 2016-07-20 DIAGNOSIS — F1220 Cannabis dependence, uncomplicated: Secondary | ICD-10-CM | POA: Diagnosis present

## 2016-07-20 DIAGNOSIS — Z630 Problems in relationship with spouse or partner: Secondary | ICD-10-CM | POA: Diagnosis not present

## 2016-07-20 DIAGNOSIS — F4323 Adjustment disorder with mixed anxiety and depressed mood: Principal | ICD-10-CM | POA: Diagnosis present

## 2016-07-20 DIAGNOSIS — Z21 Asymptomatic human immunodeficiency virus [HIV] infection status: Secondary | ICD-10-CM | POA: Diagnosis present

## 2016-07-20 DIAGNOSIS — F17200 Nicotine dependence, unspecified, uncomplicated: Secondary | ICD-10-CM | POA: Diagnosis present

## 2016-07-20 DIAGNOSIS — T447X2A Poisoning by beta-adrenoreceptor antagonists, intentional self-harm, initial encounter: Secondary | ICD-10-CM | POA: Diagnosis present

## 2016-07-20 MED ORDER — EMTRICITABINE-TENOFOVIR AF 200-25 MG PO TABS
1.0000 | ORAL_TABLET | Freq: Every day | ORAL | Status: DC
Start: 2016-07-21 — End: 2016-07-21
  Administered 2016-07-21: 1 via ORAL
  Filled 2016-07-20: qty 1

## 2016-07-20 MED ORDER — DARUNAVIR-COBICISTAT 800-150 MG PO TABS
1.0000 | ORAL_TABLET | Freq: Every day | ORAL | Status: DC
  Filled 2016-07-20: qty 1

## 2016-07-20 MED ORDER — MAGNESIUM HYDROXIDE 400 MG/5ML PO SUSP: 30.0000 mL | Freq: Every day | ORAL | Status: DC | PRN

## 2016-07-20 MED ORDER — ACETAMINOPHEN 325 MG PO TABS: 650.0000 mg | ORAL_TABLET | Freq: Four times a day (QID) | ORAL | Status: DC | PRN

## 2016-07-20 MED ORDER — NICOTINE 21 MG/24HR TD PT24
21.0000 mg | MEDICATED_PATCH | Freq: Once | TRANSDERMAL | Status: DC
  Administered 2016-07-20: 21 mg via TRANSDERMAL
  Filled 2016-07-20: qty 1

## 2016-07-20 MED ORDER — LEVETIRACETAM 500 MG PO TABS
1500.0000 mg | ORAL_TABLET | Freq: Two times a day (BID) | ORAL | Status: DC
  Filled 2016-07-20 (×2): qty 3

## 2016-07-20 MED ORDER — ALUM & MAG HYDROXIDE-SIMETH 200-200-20 MG/5ML PO SUSP: 30.0000 mL | ORAL | Status: DC | PRN

## 2016-07-20 MED ORDER — CARBAMAZEPINE 200 MG PO TABS
400.0000 mg | ORAL_TABLET | Freq: Two times a day (BID) | ORAL | Status: DC
  Administered 2016-07-20 – 2016-07-21 (×2): 400 mg via ORAL
  Filled 2016-07-20 (×2): qty 2

## 2016-07-20 NOTE — Plan of Care (Signed)
Problem: Education: Goal: Emotional status will improve Outcome: Not Progressing Patient upset at present  With his admission

## 2016-07-20 NOTE — Consult Note (Signed)
[place] Psychiatry Consult   Reason for Consult:  Consult for 46 year old male brought to the hospital after taking an intentional overdose of blood pressure medicine Referring Physician:  Mariea Clonts Patient Identification: Victor Scott MRN:  [id number] Principal Diagnosis: Suicide attempt by beta blocker overdose [facility]) Diagnosis:   Patient Active Problem List   Diagnosis Date Noted  . Suicide attempt by beta blocker overdose ([place]) [T44.7X2A] 07/19/2016  . Severe major depression, single episode ([place]) [F32.2] 07/19/2016  . HIV (human immunodeficiency virus infection) ([place]) [B20] 07/19/2016  . Seizures ([place]) [R56.9] 07/19/2016  . Acute encephalopathy [G93.40] 10/19/2015    Total Time spent with patient: 30 minutes  Subjective:   Victor Scott is a 46 y.o. male patient admitted with "I took some pills".  Follow-up note for 46 year old man who came to the hospital yesterday after taking a overdose of beta blockers with what he admitted with suicidal intent. On reevaluation today he presents a different mental status. He is wide awake and very talkative. Patient initially spoke in a manner emphasizing how he now understands his needs better and genuinely wants to do things to help himself improve. He blamed all of his problems on his wife. He was initially at least somewhat appropriate interpersonally. I made it clear to him however that under the circumstances the he started to contradict several of the things he had said previously.  HPI:  Patient interviewed chart reviewed. 46 year old man brought to the hospital after being picked up by EMS for suicide attempt. Patient says that he was in [place] today talking on his phone. He took his entire bottle of metoprolol pills. He had been talking with a friend on the phone at that time which is how someone knew that he needed help. Patient says that he is been feeling bad and depressed for a couple of months. He and his  wife are fighting constantly. His sleep is interrupted and poor. Energy poor. Appetite poor. Admits that he's been thinking about suicide for a while. Denies that he is drinking he admits that he uses marijuana regularly. Patient has HIV and a seizure disorder. He says he is compliant with his medication and that his seizures have been under reasonably good control. Denies any hallucinations or psychotic symptoms. Patient presents as withdrawn and flat but cooperative.  Social history: Patient lives with his wife. The 2 of them of only been together a couple of years. Judging from the notes in the chart it sounds like he's been out of work for quite a while because of his medical problems and that this is been a real frustration for him.  Medical history: HIV-positive. Has been living with that for about 8 years. He says he is compliant with his medicine and goes to do for his follow-up treatment. Also has partial complex seizures which have been a little difficult to control. Sees a neurologist through [place] and takes combination of Keppra and Tegretol  Substance abuse history: Denies alcohol use. Says he uses marijuana regularly denies other drug use. No evidence of any treatment in the past  Past Psychiatric History: Patient denies any past psychiatric hospitalization. Denies any suicide attempts. Has not seen psychiatrists in the past. He says he's only been feeling bad this time for a few months. There is nothing in the chart about past psychiatric condition none  Risk to Self: Is patient at risk for suicide?: Yes Risk to Others:   Prior Inpatient Therapy:   Prior Outpatient Therapy:  Past Medical History:  Past Medical History:  Diagnosis Date  . HIV (human immunodeficiency virus infection) ([place])   . Hypertension   . Seizures ([place])     Past Surgical History:  Procedure Laterality Date  . none     Family History:  Family History  Problem Relation Age of Onset  . Hypertension Father     Family Psychiatric  History: None known Social History:  History  Alcohol Use No     History  Drug Use  . Types: Marijuana    Comment: denies [id number]    Social History   Social History  . Marital status: Married    Spouse name: N/A  . Number of children: N/A  . Years of education: N/A   Social History Main Topics  . Smoking status: Current Every Day Smoker    Packs/day: 1.00    Types: Cigarettes  . Smokeless tobacco: Never Used  . Alcohol use No  . Drug use: Yes    Types: Marijuana     Comment: denies [id number]  . Sexual activity: Not Asked   Other Topics Concern  . None   Social History Narrative  . None   Additional Social History:    Allergies:   Allergies  Allergen Reactions  . Fish Allergy     Labs:  Results for orders placed or performed during the hospital encounter of 07/19/16 (from the past 48 hour(s))  Comprehensive metabolic panel     Status: Abnormal   Collection Time: 07/19/16  4:24 PM  Result Value Ref Range   Sodium 139 135 - 145 mmol/L   Potassium 3.3 (L) 3.5 - 5.1 mmol/L   Chloride 107 101 - 111 mmol/L   CO2 21 (L) 22 - 32 mmol/L   Glucose, Bld 107 (H) 65 - 99 mg/dL   BUN 8 6 - 20 mg/dL   Creatinine, Ser 0.99 0.61 - 1.24 mg/dL   Calcium 9.3 8.9 - 10.3 mg/dL   Total Protein 8.7 (H) 6.5 - 8.1 g/dL   Albumin 4.6 3.5 - 5.0 g/dL   AST 34 15 - 41 U/L   ALT 47 17 - 63 U/L   Alkaline Phosphatase 70 38 - 126 U/L   Total Bilirubin 1.0 0.3 - 1.2 mg/dL   GFR calc non Af Amer >60 >60 mL/min   GFR calc Af Amer >60 >60 mL/min    Comment: (NOTE) The eGFR has been calculated using the CKD EPI equation. This calculation has not been validated in all clinical situations. eGFR's persistently <60 mL/min signify possible Chronic Kidney Disease.    Anion gap 11 5 - 15  Ethanol     Status: None   Collection Time: 07/19/16  4:24 PM  Result Value Ref Range   Alcohol, Ethyl (B) <5 <5 mg/dL    Comment:        LOWEST DETECTABLE LIMIT FOR SERUM  ALCOHOL IS 5 mg/dL FOR MEDICAL PURPOSES ONLY   Salicylate level     Status: None   Collection Time: 07/19/16  4:24 PM  Result Value Ref Range   Salicylate Lvl <9.3 2.8 - 30.0 mg/dL  Acetaminophen level     Status: Abnormal   Collection Time: 07/19/16  4:24 PM  Result Value Ref Range   Acetaminophen (Tylenol), Serum <10 (L) 10 - 30 ug/mL    Comment:        THERAPEUTIC CONCENTRATIONS VARY SIGNIFICANTLY. A RANGE OF 10-30 ug/mL MAY BE AN EFFECTIVE CONCENTRATION FOR MANY PATIENTS. HOWEVER, SOME ARE  BEST TREATED AT CONCENTRATIONS OUTSIDE THIS RANGE. ACETAMINOPHEN CONCENTRATIONS >150 ug/mL AT 4 HOURS AFTER INGESTION AND >50 ug/mL AT 12 HOURS AFTER INGESTION ARE OFTEN ASSOCIATED WITH TOXIC REACTIONS.   cbc     Status: Abnormal   Collection Time: 07/19/16  4:24 PM  Result Value Ref Range   WBC 4.6 3.8 - 10.6 K/uL   RBC 6.61 (H) 4.40 - 5.90 MIL/uL   Hemoglobin 16.0 13.0 - 18.0 g/dL   HCT 50.1 40.0 - 52.0 %   MCV 75.8 (L) 80.0 - 100.0 fL   MCH 24.2 (L) 26.0 - 34.0 pg   MCHC 31.9 (L) 32.0 - 36.0 g/dL   RDW 15.7 (H) 11.5 - 14.5 %   Platelets 214 150 - 440 K/uL  Urine Drug Screen, Qualitative     Status: Abnormal   Collection Time: 07/19/16  4:24 PM  Result Value Ref Range   Tricyclic, Ur Screen NONE DETECTED NONE DETECTED   Amphetamines, Ur Screen NONE DETECTED NONE DETECTED   MDMA (Ecstasy)Ur Screen NONE DETECTED NONE DETECTED   Cocaine Metabolite,Ur [place] NONE DETECTED NONE DETECTED   Opiate, Ur Screen NONE DETECTED NONE DETECTED   Phencyclidine (PCP) Ur S NONE DETECTED NONE DETECTED   Cannabinoid 50 Ng, Ur [place] POSITIVE (A) NONE DETECTED   Barbiturates, Ur Screen NONE DETECTED NONE DETECTED   Benzodiazepine, Ur Scrn NONE DETECTED NONE DETECTED   Methadone Scn, Ur NONE DETECTED NONE DETECTED    Comment: (NOTE) 709  Tricyclics, urine               Cutoff 1000 ng/mL 200  Amphetamines, urine             Cutoff 1000 ng/mL 300  MDMA (Ecstasy), urine           Cutoff 500 ng/mL 400   Cocaine Metabolite, urine       Cutoff 300 ng/mL 500  Opiate, urine                   Cutoff 300 ng/mL 600  Phencyclidine (PCP), urine      Cutoff 25 ng/mL 700  Cannabinoid, urine              Cutoff 50 ng/mL 800  Barbiturates, urine             Cutoff 200 ng/mL 900  Benzodiazepine, urine           Cutoff 200 ng/mL 1000 Methadone, urine                Cutoff 300 ng/mL 1100 1200 The urine drug screen provides only a preliminary, unconfirmed 1300 analytical test result and should not be used for non-medical 1400 purposes. Clinical consideration and professional judgment should 1500 be applied to any positive drug screen result due to possible 1600 interfering substances. A more specific alternate chemical method 1700 must be used in order to obtain a confirmed analytical result.  1800 Gas chromato graphy / mass spectrometry (GC/MS) is the preferred 1900 confirmatory method.   Acetaminophen level     Status: Abnormal   Collection Time: 07/19/16 10:17 PM  Result Value Ref Range   Acetaminophen (Tylenol), Serum <10 (L) 10 - 30 ug/mL    Comment:        THERAPEUTIC CONCENTRATIONS VARY SIGNIFICANTLY. A RANGE OF 10-30 ug/mL MAY BE AN EFFECTIVE CONCENTRATION FOR MANY PATIENTS. HOWEVER, SOME ARE BEST TREATED AT CONCENTRATIONS OUTSIDE THIS RANGE. ACETAMINOPHEN CONCENTRATIONS >150 ug/mL AT 4 HOURS AFTER INGESTION AND >50 ug/mL  AT 12 HOURS AFTER INGESTION ARE OFTEN ASSOCIATED WITH TOXIC REACTIONS.   Carbamazepine level, total     Status: Abnormal   Collection Time: 07/19/16 10:17 PM  Result Value Ref Range   Carbamazepine Lvl 2.0 (L) 4.0 - 12.0 ug/mL    Current Facility-Administered Medications  Medication Dose Route Frequency Provider Last Rate Last Dose  . carbamazepine (TEGRETOL) tablet 400 mg  400 mg Oral BID AC Decarlo Rivet T, MD   400 mg at 07/20/16 0911  . darunavir-cobicistat (PREZCOBIX) 800-150 MG per tablet 1 tablet  1 tablet Oral Q breakfast Bricen Victory T, MD      .  diphenhydrAMINE (BENADRYL) injection 50 mg  50 mg Intravenous Once Eula Listen, MD   Stopped at 07/19/16 1853  . emtricitabine-tenofovir AF (DESCOVY) 200-25 MG per tablet 1 tablet  1 tablet Oral Daily Alesana Magistro, Madie Reno, MD   1 tablet at 07/20/16 1159  . haloperidol lactate (HALDOL) injection 5 mg  5 mg Intravenous Once Eula Listen, MD   Stopped at 07/19/16 1853  . levETIRAcetam (KEPPRA) tablet 1,500 mg  1,500 mg Oral BID Leilan Bochenek, Madie Reno, MD   Stopped at 07/19/16 2135  . LORazepam (ATIVAN) injection 2 mg  2 mg Intravenous Once Eula Listen, MD   Stopped at 07/19/16 1853  . nicotine (NICODERM CQ - dosed in mg/24 hours) patch 21 mg  21 mg Transdermal Once Harvest Dark, MD   21 mg at 07/20/16 1159   Current Outpatient Prescriptions  Medication Sig Dispense Refill  . aspirin EC 81 MG tablet Take 81 mg by mouth daily.    . baclofen (LIORESAL) 10 MG tablet Take 10 mg by mouth 3 (three) times daily.    . carbamazepine (TEGRETOL) 200 MG tablet Take 1 tablet (200 mg total) by mouth 2 (two) times daily. (Patient taking differently: Take 400 mg by mouth 2 (two) times daily. ) 28 tablet 0  . darunavir-cobicistat (PREZCOBIX) 800-150 MG tablet Take 1 tablet by mouth daily with breakfast. Swallow whole. Do NOT crush, break or chew tablets. Take with food.    Marland Kitchen emtricitabine-tenofovir AF (DESCOVY) 200-25 MG tablet Take 1 tablet by mouth daily.    . meloxicam (MOBIC) 15 MG tablet Take 1 tablet by mouth daily.    . metoprolol succinate (TOPROL-XL) 50 MG 24 hr tablet Take 1 tablet (50 mg total) by mouth daily. 15 tablet 0    Musculoskeletal: Strength & Muscle Tone: within normal limits Gait & Station: normal Patient leans: N/A  Psychiatric Specialty Exam: Physical Exam  Nursing note and vitals reviewed. Constitutional: He appears well-developed and well-nourished.  HENT:  Head: Normocephalic and atraumatic.  Eyes: Pupils are equal, round, and reactive to light. Conjunctivae  are normal.  Neck: Normal range of motion.  Cardiovascular: Normal heart sounds.   Respiratory: Effort normal.  GI: Soft.  Musculoskeletal: Normal range of motion.  Neurological: He is alert.  Skin: Skin is warm and dry.  Psychiatric: His affect is angry and labile. His affect is not blunt. His speech is rapid and/or pressured and tangential. His speech is not delayed. He is agitated and aggressive. He is not slowed and not withdrawn. Cognition and memory are normal. He expresses impulsivity. He expresses no suicidal ideation.    Review of Systems  Constitutional: Negative for malaise/fatigue.  HENT: Negative.   Eyes: Negative.   Respiratory: Negative.   Cardiovascular: Negative.   Gastrointestinal: Negative.   Musculoskeletal: Negative.   Skin: Negative.   Neurological: Negative for weakness.  Psychiatric/Behavioral: Positive for depression and suicidal ideas. Negative for hallucinations, memory loss and substance abuse. The patient has insomnia. The patient is not nervous/anxious.     Blood pressure 108/74, pulse 61, temperature 98.4 F (36.9 C), temperature source Oral, resp. rate 16, height _0  (1.854 m), weight (!) 149.7 kg (330 lb), SpO2 96 %.Body mass index is 43.54 kg/m.  General Appearance: Casual  Eye Contact:  Good  Speech:  Clear and Coherent  Volume:  Increased  Mood:  Angry and Irritable  Affect:  Congruent and Inappropriate  Thought Process:  Goal Directed  Orientation:  Full (Time, Place, and Person)  Thought Content:  Tangential  Suicidal Thoughts:  Yes.  with intent/plan  Homicidal Thoughts:  No  Memory:  Immediate;   Fair Recent;   Fair Remote;   Fair  Judgement:  Impaired  Insight:  Fair  Psychomotor Activity:  Restlessness  Concentration:  Concentration: Fair  Recall:  [employer] of Knowledge:  Fair  Language:  Fair  Akathisia:  No  Handed:  Right  AIMS (if indicated):     Assets:  Housing  ADL's:  Intact  Cognition:  Impaired,  Mild  Sleep:         Treatment Plan Summary: Daily contact with patient to assess and evaluate symptoms and progress in treatment, Medication management and Plan 46 year old man without a past psychiatric history but who describes weeks to months of symptoms of depression. Patient states that he took 60 tablets of extended release metoprolol 50 mg each at around 3:30 today. He indicates that he did this with suicidal intent and has been depressed recently. Patient is being evaluated and worked up by emergency room staff and medical staff. Anticipate that it would be likely that he will need medical stabilization either in the inpatient hospital or emergency room before depressive symptoms can be directly addressed. Continue IVC. I put in orders to restart his seizure medicine and his HIV medicine and get blood levels of his seizure medicine. No reason to start antidepressants yet at this point. I will continue to follow along wherever he is with the understanding that he is likely to need inpatient hospitalization once he is medically cleared.  Disposition: No evidence of imminent risk to self or others at present.   Patient does not meet criteria for psychiatric inpatient admission. Supportive therapy provided about ongoing stressors.  Alethia Berthold, MD 07/20/2016 12:29 PM

## 2016-07-20 NOTE — ED Provider Notes (Signed)
-----------------------------------------   5:49 AM on 07/20/2016 -----------------------------------------   Blood pressure 94/76, pulse (!) 58, temperature 98.4 F (36.9 C), temperature source Oral, resp. rate 16, height 1.854 m (6\' 1" ), weight (!) 149.7 kg (330 lb), SpO2 98 %.  The patient had no acute events since last update.  Calm and cooperative at this time.  Disposition is pending Psychiatry/Behavioral Medicine team recommendations.  Patient is medically stable.     Loleta RoseForbach, Madelena Maturin, MD 07/20/16 [id number]

## 2016-07-20 NOTE — BH Assessment (Signed)
Assessment Note  Victor Scott is an 46 y.o. male who presents to the ER due to taking an intentional overdose of his medications, with the plan of ending his life. Patient reports of having several stressors and he felt it was best that he was dead. Per his report, he and his wife are currently having marital problems. The problems started when his PCP "took me out of work." The wife start telling people she is the only one working and patient have no money. Patient also reports of having health problems and he is unable to do as he "use to." Patient believes his wife is having an affair. "She have these males friends, I've never met. She keep telling me they her best friend but she don't want me to meet them."  Patient got married 10/2015. They have known each other for approximately two years. Patient is currently homeless. "I live with different people."  Patient denies having history of violence and aggression. He also reports of having no involvement with the legal system. During the interview, the patient was calm, cooperative and pleasant. He was able to answer questions appropriately.   Diagnosis: Depression  Past Medical History:  Past Medical History:  Diagnosis Date  . HIV (human immunodeficiency virus infection) ([place])   . Hypertension   . Seizures ([place])     Past Surgical History:  Procedure Laterality Date  . none      Family History:  Family History  Problem Relation Age of Onset  . Hypertension Father     Social History:  reports that he has been smoking Cigarettes.  He has been smoking about 1.00 pack per day. He has never used smokeless tobacco. He reports that he uses drugs, including Marijuana. He reports that he does not drink alcohol.  Additional Social History:  Alcohol / Drug Use Pain Medications: See PTA Prescriptions: See PTA Over the Counter: See PTA History of alcohol / drug use?: Yes Longest period of sobriety (when/how long): Unable to  quantify Negative Consequences of Use: Personal relationships, Financial Withdrawal Symptoms:  (Reports of none) Substance #1 Name of Substance 1: Cannabis  CIWA: CIWA-Ar BP: 108/74 Pulse Rate: 61 COWS:    Allergies:  Allergies  Allergen Reactions  . Fish Allergy     Home Medications:  (Not in a hospital admission)  OB/GYN Status:  No LMP for male patient.  General Assessment Data Location of Assessment: [facility] ED TTS Assessment: In system Is this a Tele or Face-to-Face Assessment?: Face-to-Face Is this an Initial Assessment or a Re-assessment for this encounter?: Initial Assessment Marital status: Married [place] name: n/a Is patient pregnant?: No Pregnancy Status: No Living Arrangements: Spouse/significant other Can pt return to current living arrangement?: Yes Admission Status: Involuntary Is patient capable of signing voluntary admission?: No (Under IVC) Referral Source: Self/Family/Friend Insurance type: None  Medical Screening Exam ([place]) Medical Exam completed: Yes  Crisis Care Plan Living Arrangements: Spouse/significant other Legal Guardian: Other: (Self) Name of Psychiatrist: Reports of none Name of Therapist: Reports of none  Education Status Is patient currently in school?: No Current Grade: n/a Highest grade of school patient has completed: High School Name of school: n/a Contact person: n/a  Risk to self with the past 6 months Suicidal Ideation: Yes-Currently Present Has patient been a risk to self within the past 6 months prior to admission? : Yes Suicidal Intent: Yes-Currently Present Has patient had any suicidal intent within the past 6 months prior to admission? : Yes Is  patient at risk for suicide?: Yes Suicidal Plan?: Yes-Currently Present Has patient had any suicidal plan within the past 6 months prior to admission? : Yes Specify Current Suicidal Plan: Overdose on medications Access to Means: Yes Specify Access to Suicidal  Means: Prescription Medications What has been your use of drugs/alcohol within the last 12 months?: Cannabis Previous Attempts/Gestures: Yes How many times?: 1 Other Self Harm Risks: Reports of none Triggers for Past Attempts: Family contact, Other (Comment) Intentional Self Injurious Behavior: None Family Suicide History: No Recent stressful life event(s): Conflict (Comment), Divorce, Other (Comment), Financial Problems, Job Loss Persecutory voices/beliefs?: No Depression: Yes Depression Symptoms: Isolating, Fatigue, Guilt, Feeling worthless/self pity Substance abuse history and/or treatment for substance abuse?: Yes Suicide prevention information given to non-admitted patients: Not applicable  Risk to Others within the past 6 months Homicidal Ideation: No Does patient have any lifetime risk of violence toward others beyond the six months prior to admission? : No Thoughts of Harm to Others: No Current Homicidal Intent: No Current Homicidal Plan: No Access to Homicidal Means: No Identified Victim: Reports of none History of harm to others?: No Assessment of Violence: None Noted Violent Behavior Description: Reports of none Does patient have access to weapons?: No Criminal Charges Pending?: No Does patient have a court date: No Is patient on probation?: No  Psychosis Hallucinations: None noted Delusions: None noted  Mental Status Report Appearance/Hygiene: Unremarkable, In scrubs Eye Contact: Poor Motor Activity: Freedom of movement, Unremarkable Speech: Logical/coherent Level of Consciousness: Alert Mood: Depressed, Anxious, Sad, Pleasant Affect: Depressed, Appropriate to circumstance Anxiety Level: Minimal Thought Processes: Coherent, Relevant Judgement: Unimpaired Orientation: Person, Place, Time, Situation, Appropriate for developmental age Obsessive Compulsive Thoughts/Behaviors: Minimal  Cognitive Functioning Concentration: Normal Memory: Recent Intact, Remote  Intact IQ: Average Insight: Fair Impulse Control: Poor Appetite: Fair Weight Loss: 0 Weight Gain: 0 Sleep: No Change Total Hours of Sleep: 8 Vegetative Symptoms: None  ADLScreening [facility] Assessment Services) Patient's cognitive ability adequate to safely complete daily activities?: Yes Patient able to express need for assistance with ADLs?: Yes Independently performs ADLs?: Yes (appropriate for developmental age)  Prior Inpatient Therapy Prior Inpatient Therapy: Yes Prior Therapy Dates: 06/2010 Prior Therapy Facilty/Provider(s[facility] Reason for Treatment: Depression  Prior Outpatient Therapy Prior Outpatient Therapy: No Prior Therapy Dates: Reports of none Prior Therapy Facilty/Provider(s): Reports of none Reason for Treatment: Reports of none Does patient have an ACCT team?: No Does patient have Intensive In-House Services?  : No Does patient have [facility] services? : No Does patient have P4CC services?: No  ADL Screening (condition at time of admission) Patient's cognitive ability adequate to safely complete daily activities?: Yes Is the patient deaf or have difficulty hearing?: No Does the patient have difficulty seeing, even when wearing glasses/contacts?: No Does the patient have difficulty concentrating, remembering, or making decisions?: No Patient able to express need for assistance with ADLs?: Yes Does the patient have difficulty dressing or bathing?: No Independently performs ADLs?: Yes (appropriate for developmental age) Does the patient have difficulty walking or climbing stairs?: No Weakness of Legs: None Weakness of Arms/Hands: None  Home Assistive Devices/Equipment Home Assistive Devices/Equipment: None  Therapy Consults (therapy consults require a physician order) PT Evaluation Needed: No OT Evalulation Needed: No SLP Evaluation Needed: No Abuse/Neglect Assessment (Assessment to be complete while patient is alone) Physical Abuse: Denies Verbal  Abuse: Denies Sexual Abuse: Denies Exploitation of patient/patient's resources: Denies Self-Neglect: Denies Values / Beliefs Cultural Requests During Hospitalization: None Spiritual Requests During Hospitalization: None  Consults Spiritual Care Consult Needed: No Social Work Consult Needed: No Regulatory affairs officer (For Healthcare) Does Patient Have a Medical Advance Directive?: No    Additional Information 1:1 In Past 12 Months?: No CIRT Risk: No Elopement Risk: No Does patient have medical clearance?: Yes  Child/Adolescent Assessment Running Away Risk: Denies (Patient is an adult)  Disposition:  Disposition Initial Assessment Completed for this Encounter: Yes Disposition of Patient: Other dispositions (ER MD Ordered Psych Consult)  On Site Evaluation by:   Reviewed with Physician:    Gunnar Fusi MS, LCAS, LPC, [place], CCSI Therapeutic Triage Specialist 07/20/2016 1:57 PM

## 2016-07-20 NOTE — Plan of Care (Signed)
Problem: Education: Goal: Knowledge of [facility] Education information/materials will improve Outcome: Progressing  Able to verbalize information  Given

## 2016-07-20 NOTE — ED Notes (Signed)
Pt's wife visiting patient.

## 2016-07-20 NOTE — ED Notes (Signed)
Pt given lunch tray. Victor Scott is in with pt at this time.

## 2016-07-20 NOTE — ED Notes (Signed)
Spouse left and pt appears to be tearfully upset. Pt seen on his knees in what appeared to be prayer. Pt is currently sitting on end of bed with head in hand.

## 2016-07-20 NOTE — Tx Team (Signed)
Initial Treatment Plan 07/20/2016 5:09 PM Victor Scott Fei [id number]    PATIENT STRESSORS: Financial difficulties Marital or family conflict Substance abuse   PATIENT STRENGTHS: Ability for insight Active sense of humor Average or above average intelligence Capable of independent living Communication skills Supportive family/friends   PATIENT IDENTIFIED PROBLEMS: Suicidal 07/20/16  Separation from Wife  07/20/16                   DISCHARGE CRITERIA:  Ability to meet basic life and health needs Improved stabilization in mood, thinking, and/or behavior  PRELIMINARY DISCHARGE PLAN: Outpatient therapy Return to previous living arrangement  PATIENT/FAMILY INVOLVEMENT: This treatment plan has been presented to and reviewed with the patient, Victor Scott Dolinar, and/or family member,  .  The patient and family have been given the opportunity to ask questions and make suggestions.  Crist InfanteGwen A Trinh Sanjose, RN 07/20/2016, 5:09 PM

## 2016-07-20 NOTE — ED Notes (Signed)
This tech asked pt 3 different times if he was going to get a shower pt has refused all three times

## 2016-07-20 NOTE — ED Notes (Signed)
Daniel from poison control called to get an update on the Pt. Information was given and case was closed.

## 2016-07-20 NOTE — Plan of Care (Signed)
Problem: Education: Goal: Verbalization of understanding the information provided will improve Outcome: Progressing Patient able to verbalize information receaved

## 2016-07-20 NOTE — Plan of Care (Signed)
Problem: Self-Concept: Goal: Ability to disclose and discuss suicidal ideas will improve Outcome: Not Progressing New admission

## 2016-07-20 NOTE — Plan of Care (Signed)
Problem: Education: Goal: Mental status will improve Outcome: Progressing No angry out burst

## 2016-07-20 NOTE — Progress Notes (Signed)
Admission Note: Report from  Katie    D: Pt appeared depressed  With  a flat affect.  Pt  denies SI / although he stated he was just trying to get some attention . Patient stated he didn't take any pills. Patient has HIV for 8 years . Also hypertension.  Patient had argument with wife   Stated he didn't  Want to talk with her pastor or any therapist or minister. [place] and [place]  To clear his head. Allergic to fish. postive Results  with pot.   Pt is redirectable and cooperative with assessment.      A: Pt admitted to unit per protocol, skin assessment and search done and no contraband found.  Pt  educated on therapeutic milieu rules. Pt was introduced to milieu by nursing staff.    R: Pt was receptive to education about the milieu .  15 min safety checks started. Clinical research associatewriter offered support

## 2016-07-20 NOTE — BH Assessment (Signed)
[facility] Dr. Toni Amendlapacs.  Attending Physician will be Dr. Jennet MaduroPucilowska.   [facility] [place].   Intake Paper Work has been signed and placed on patient chart.  ER staff is aware of the admission (Dr. Lenard LancePaduchowski, ER MD; Florentina AddisonKatie, Patient's Nurse & Dedra SkeensGwen, Patient Access).

## 2016-07-20 NOTE — Plan of Care (Signed)
Problem: Self-Concept: Goal: Ability to verbalize positive feelings about self will improve Outcome: Not Progressing New admission

## 2016-07-20 NOTE — ED Notes (Signed)
Pt has gotten upset due to being admitted per Dr. Gerre Pebbleslapac. Pt stated he does not need to stay and all he has to do is get past the officers and then there is nothing they can do. They cannot force me to get treatment. "Call my wife and ask her to come see me." Wife stated she would come and assured this tech there would be no trouble that she needs her job to much to have a confrontation with her spouse. Pt jerked IV out of his arm and dropped blood all around the room. This tech put gauze and tape on pt left AC to control the bleeding and tech and RN, Florentina AddisonKatie used towels and wipes to get up some of the blood. HSK, Mauricio has mopped and cleaned the floor. Pt is at this time talking with [employer] and is doing better. This tech has had a relatively good relationship with this pt since he came in yesterday and the pt will talk to me and let me know what is making him upset. Pt will allow this tech to do my job. Pt has not lashed out physically at me.

## 2016-07-21 ENCOUNTER — Encounter: Payer: Self-pay | Admitting: Psychiatry

## 2016-07-21 DIAGNOSIS — I10 Essential (primary) hypertension: Secondary | ICD-10-CM | POA: Diagnosis present

## 2016-07-21 DIAGNOSIS — F17200 Nicotine dependence, unspecified, uncomplicated: Secondary | ICD-10-CM | POA: Diagnosis present

## 2016-07-21 DIAGNOSIS — F1220 Cannabis dependence, uncomplicated: Secondary | ICD-10-CM | POA: Diagnosis present

## 2016-07-21 DIAGNOSIS — F4323 Adjustment disorder with mixed anxiety and depressed mood: Principal | ICD-10-CM

## 2016-07-21 LAB — TSH: TSH: 1.086 u[IU]/mL (ref 0.350–4.500)

## 2016-07-21 LAB — LIPID PANEL
Cholesterol: 161 mg/dL (ref 0–200)
HDL: 21 mg/dL — ABNORMAL LOW (ref >40)
LDL CALC: 97 mg/dL (ref 0–99)
TRIGLYCERIDES: 213 mg/dL — AB (ref <150)
Total CHOL/HDL Ratio: 7.7 RATIO
VLDL: 43 mg/dL — ABNORMAL HIGH (ref 0–40)

## 2016-07-21 MED ORDER — CARBAMAZEPINE 200 MG PO TABS: 400.0000 mg | ORAL_TABLET | Freq: Two times a day (BID) | ORAL | 1 refills | Status: DC

## 2016-07-21 MED ORDER — LEVETIRACETAM 750 MG PO TABS: 1500.0000 mg | ORAL_TABLET | Freq: Two times a day (BID) | ORAL | 1 refills | Status: DC

## 2016-07-21 MED ORDER — CARBAMAZEPINE 200 MG PO TABS: 400.0000 mg | ORAL_TABLET | Freq: Two times a day (BID) | ORAL | 1 refills | Status: AC

## 2016-07-21 NOTE — Plan of Care (Signed)
Problem: Activity: Goal: Sleeping patterns will improve Outcome: Progressing Patient slept for Estimated Hours of 6.15; Precautionary checks every 15 minutes for safety maintained, room free of safety hazards, patient sustains no injury or falls during this shift.    

## 2016-07-21 NOTE — [facility] Suicide Risk Assessment (Signed)
[facility] INPATIENT:  Family/Significant Other Suicide Prevention Education  Suicide Prevention Education:  Contact Attempts: Victor Scott, sister, [phone], has been identified by the patient as the family member/significant other with whom the patient will be residing, and identified as the person(s) who will aid the patient in the event of a mental health crisis.  With written consent from the patient, two attempts were made to provide suicide prevention education, prior to and/or following the patient's discharge.  We were unsuccessful in providing suicide prevention education.  A suicide education pamphlet was given to the patient to share with family/significant other.  Date and time of first attempt:07/21/16, 1120 Date and time of second attempt:  Victor Scott, Victor Dougal Jon, LCSW 07/21/2016, 11:24 AM

## 2016-07-21 NOTE — [facility] Suicide Risk Assessment (Signed)
[facility]   Principal Problem: Adjustment disorder with mixed anxiety and depressed mood Discharge Diagnoses:  Patient Active Problem List   Diagnosis Date Noted  . HTN (hypertension) [I10] 07/21/2016  . Cannabis use disorder, moderate, dependence (HCC) [F12.20] 07/21/2016  . Tobacco use disorder [F17.200] 07/21/2016  . Adjustment disorder with mixed anxiety and depressed mood [F43.23] 07/20/2016  . Suicide attempt by beta blocker overdose (HCC) [T44.7X2A] 07/19/2016  . HIV (human immunodeficiency virus infection) (HCC) [B20] 07/19/2016  . Seizures (HCC) [R56.9] 07/19/2016  . Acute encephalopathy [G93.40] 10/19/2015    Total Time spent with patient: 1 hour  Musculoskeletal: Strength & Muscle Tone: within normal limits Gait & Station: normal Patient leans: N/A  Psychiatric Specialty Exam: Review of Systems  Neurological: Positive for seizures.  Psychiatric/Behavioral: Positive for substance abuse.  All other systems reviewed and are negative.   Blood pressure (!) 150/83, pulse (!) 54, temperature 98.1 F (36.7 C), temperature source Oral, resp. rate 18, height 6\' 1"  (1.854 m), weight 133.4 kg (294 lb), SpO2 100 %.Body mass index is 38.79 kg/m.  General Appearance: Casual  Eye Contact::  Good  Speech:  Clear and Coherent409  Volume:  Normal  Mood:  Euthymic  Affect:  Appropriate  Thought Process:  Goal Directed and Descriptions of Associations: Intact  Orientation:  Full (Time, Place, and Person)  Thought Content:  WDL  Suicidal Thoughts:  No  Homicidal Thoughts:  No  Memory:  Immediate;   Fair Recent;   Fair Remote;   Fair  Judgement:  Impaired  Insight:  Present  Psychomotor Activity:  Normal  Concentration:  Fair  Recall:  [employer]Fair  Fund of Knowledge:Fair  Language: Fair  Akathisia:  No  Handed:  Right  AIMS (if indicated):     Assets:  Communication Skills Desire for Improvement Housing Resilience Social Support  Sleep:  Number  of Hours: 6.15  Cognition: WNL  ADL's:  Intact   Mental Status Per Nursing Assessment::   On Admission:     Demographic Factors:  Male, Low socioeconomic status and Unemployed  Loss Factors: Loss of significant relationship and Financial problems/change in socioeconomic status  Historical Factors: Family history of mental illness or substance abuse and Impulsivity  Risk Reduction Factors:   Responsible for children under 45 years of age, Sense of responsibility to family, Religious beliefs about death, Living with another person, especially a relative and Positive social support  Continued Clinical Symptoms:  Alcohol/Substance Abuse/Dependencies Epilepsy Medical Diagnoses and Treatments/Surgeries  Cognitive Features That Contribute To Risk:  None    Suicide Risk:  Minimal: No identifiable suicidal ideation.  Patients presenting with no risk factors but with morbid ruminations; may be classified as minimal risk based on the severity of the depressive symptoms    Plan Of Care/Follow-up recommendations:  Activity:  as tolerated. Diet:  low sodium heart healthy. Other:  keep follow up appintments.  Kristine LineaJolanta Tarrance Januszewski, MD 07/21/2016, 11:06 AM

## 2016-07-21 NOTE — [facility] Suicide Risk Assessment (Signed)
[facility] Admission Suicide Risk Assessment   Nursing information obtained from:    Demographic factors:    Current Mental Status:    Loss Factors:    Historical Factors:    Risk Reduction Factors:     Total Time spent with patient: 1 hour Principal Problem: Adjustment disorder with mixed anxiety and depressed mood Diagnosis:   Patient Active Problem List   Diagnosis Date Noted  . HTN (hypertension) [I10] 07/21/2016  . Cannabis use disorder, moderate, dependence (HCC) [F12.20] 07/21/2016  . Tobacco use disorder [F17.200] 07/21/2016  . Adjustment disorder with mixed anxiety and depressed mood [F43.23] 07/20/2016  . Suicide attempt by beta blocker overdose (HCC) [T44.7X2A] 07/19/2016  . HIV (human immunodeficiency virus infection) (HCC) [B20] 07/19/2016  . Seizures (HCC) [R56.9] 07/19/2016  . Acute encephalopathy [G93.40] 10/19/2015   Subjective Data: overdose.  Continued Clinical Symptoms:  Alcohol Use Disorder Identification Test Final Score (AUDIT): 0 The "Alcohol Use Disorders Identification Test", Guidelines for Use in Primary Care, Second Edition.  World Science writer [facility]). Score between 0-7:  no or low risk or alcohol related problems. Score between 8-15:  moderate risk of alcohol related problems. Score between 16-19:  high risk of alcohol related problems. Score 20 or above:  warrants further diagnostic evaluation for alcohol dependence and treatment.   CLINICAL FACTORS:   Depression:   Comorbid alcohol abuse/dependence Impulsivity Alcohol/Substance Abuse/Dependencies Epilepsy Medical Diagnoses and Treatments/Surgeries   Musculoskeletal: Strength & Muscle Tone: within normal limits Gait & Station: normal Patient leans: N/A  Psychiatric Specialty Exam: Physical Exam  Nursing note and vitals reviewed. Psychiatric: He has a normal mood and affect. His speech is normal and behavior is normal. Thought content normal. Cognition and memory are normal. He expresses  impulsivity.    Review of Systems  Psychiatric/Behavioral: Positive for substance abuse.  All other systems reviewed and are negative.   Blood pressure (!) 150/83, pulse (!) 54, temperature 98.1 F (36.7 C), temperature source Oral, resp. rate 18, height 6\' 1"  (1.854 m), weight 133.4 kg (294 lb), SpO2 100 %.Body mass index is 38.79 kg/m.  General Appearance: Casual  Eye Contact:  Good  Speech:  Clear and Coherent  Volume:  Normal  Mood:  Euthymic  Affect:  Appropriate  Thought Process:  Goal Directed and Descriptions of Associations: Intact  Orientation:  Full (Time, Place, and Person)  Thought Content:  WDL  Suicidal Thoughts:  No  Homicidal Thoughts:  No  Memory:  Immediate;   Fair Recent;   Fair Remote;   Fair  Judgement:  Impaired  Insight:  Present  Psychomotor Activity:  Normal  Concentration:  Concentration: Fair and Attention Span: Fair  Recall:  [employer] of Knowledge:  Fair  Language:  Fair  Akathisia:  No  Handed:  Right  AIMS (if indicated):     Assets:  Communication Skills Desire for Improvement Housing Resilience Social Support  ADL's:  Intact  Cognition:  WNL  Sleep:  Number of Hours: 6.15      COGNITIVE FEATURES THAT CONTRIBUTE TO RISK:  None    SUICIDE RISK:   Minimal: No identifiable suicidal ideation.  Patients presenting with no risk factors but with morbid ruminations; may be classified as minimal risk based on the severity of the depressive symptoms  PLAN OF CARE: hospital admission, medication management, substance abuse counseling, discharge planning.  Mr. Arcidiacono is a 46 year old male with no past paychiatric history admitted after overdose on blood pressure medication in the context of marital discord.  1. Suicidal ideation. The patient adamantly denies any thoughts, intention, or plans to hurt himself or others. He is able to contract for safety. He is forward thinking and optimistic about the future. He is a loving father and  grandfather 6 grandchildren.  2. Mood. The patient denies any symptoms of depression and is not interested in pharmacotherapy. He is interested in faith counseling following discharge.  3. Cannabis abuse. The patient minimizes his problems but is interested in substance abuse treatment.  4. Hypertension. He is on aspirin Toprol. We have Toprol due to overdose. His blood pressure is again elevated.  5. Seizure. We continued Tegretol.  6. HIV. We continued antiretrovirals.  7. Disposition. He will be discharged with his mother. [place] [facility] [place] with [place] uncle [place].  I certify that inpatient services furnished can reasonably be expected to improve the patient's condition.   Kristine LineaJolanta Pucilowska, MD 07/21/2016, 10:26 AM

## 2016-07-21 NOTE — H&P (Signed)
Psychiatric Admission Assessment Adult  Patient Identification: Victor Scott MRN:  [id number] Date of Evaluation:  07/21/2016 Chief Complaint:  depession Principal Diagnosis: Adjustment disorder with mixed anxiety and depressed mood Diagnosis:   Patient Active Problem List   Diagnosis Date Noted  . HTN (hypertension) [I10] 07/21/2016  . Cannabis use disorder, moderate, dependence (HCC) [F12.20] 07/21/2016  . Tobacco use disorder [F17.200] 07/21/2016  . Adjustment disorder with mixed anxiety and depressed mood [F43.23] 07/20/2016  . Suicide attempt by beta blocker overdose (HCC) [T44.7X2A] 07/19/2016  . HIV (human immunodeficiency virus infection) (HCC) [B20] 07/19/2016  . Seizures (HCC) [R56.9] 07/19/2016  . Acute encephalopathy [G93.40] 10/19/2015   History of Present Illness:   Identifying data. Victor Scott is a 46 year old male with no past psychiatric history.  Chief complaint. "I was trying to get my wife's attention."  History of present illness. Information was obtained from the patient's chart. For the past 2 months the patient has been experiencing marital problems and has been separated from his wife. On the day of admission he overdosed on metoprolol that is prescribed to him for hypertension but immediately called a friend and was brought to the hospital. He denies suicidal intention and explained that he was trying to get his wife's attention. Indeed she visited him in the emergency room. The patient realizes that he has been less than ideal husband, not wanting to talk to his wife about these problems, and withholding self-critical health information from her. During the past 2 months, he did a lot of soul searching, started reading the Bible, but connected with his church friends and decided that he would do what is necessary to reveal his marriage. He denies symptoms of depression, anxiety, psychosis or symptoms suggestive of bipolar disorder. This overdose was impulsive  and did not have prior suicidal thoughts he smokes marijuana regularly but denies alcohol, illicit substance, or prescription pill abuse.  Past psychiatric history. He has never been hospitalized, treated for mental illness, or attempted suicide. He has past history of cocaine use but has been clean of heart substances for years. He has been in prison several times for a total of 21 years beginning at the age of 14. He has never been in mental health while incarcerated.  Family psychiatric history. His father and 2 brothers died of alcoholism.  Social history. He has been married for over a year ago to a woman is known for several years. He was not forthcoming about his HIV status with her and it created a rift as she has learned about it from Dr. Sampson Goon when the patient was hospitalized for seizures in the fall. He has been seizure-free for 6 months and is not allowed to return to work. 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 It is difficult for him to get a job given his criminal past. He has a son who is 27 years old and has 6 grandchildren. There are no current legal charges pending.  Total Time spent with patient: 1 hour  Is the patient at risk to self? No.  Has the patient been a risk to self in the past 6 months? No.  Has the patient been a risk to self within the distant past? No.  Is the patient a risk to others? No.  Has the patient been a risk to others in the past 6 months? No.  Has the patient been a risk to others within the distant past? No.   Prior Inpatient Therapy:   Prior  Outpatient Therapy:    Alcohol Screening: 1. How often do you have a drink containing alcohol?: Never 2. How many drinks containing alcohol do you have on a typical day when you are drinking?: 1 or 2 3. How often do you have six or more drinks on one occasion?: Never Preliminary Score: 0 4. How often during the last year have you found that you were not able to stop drinking once you had  started?: Never 5. How often during the last year have you failed to do what was normally expected from you becasue of drinking?: Never 6. How often during the last year have you needed a first drink in the morning to get yourself going after a heavy drinking session?: Never 7. How often during the last year have you had a feeling of guilt of remorse after drinking?: Never 8. How often during the last year have you been unable to remember what happened the night before because you had been drinking?: Never 9. Have you or someone else been injured as a result of your drinking?: No 10. Has a relative or friend or a doctor or another health worker been concerned about your drinking or suggested you cut down?: No Alcohol Use Disorder Identification Test Final Score (AUDIT): 0 Brief Intervention: AUDIT score less than 7 or less-screening does not suggest unhealthy drinking-brief intervention not indicated Substance Abuse History in the last 12 months:  Yes.   Consequences of Substance Abuse: Negative Previous Psychotropic Medications: No  Psychological Evaluations: No  Past Medical History:  Past Medical History:  Diagnosis Date  . HIV (human immunodeficiency virus infection) (HCC)   . Hypertension   . Seizures (HCC)     Past Surgical History:  Procedure Laterality Date  . none     Family History:  Family History  Problem Relation Age of Onset  . Hypertension Father    Tobacco Screening: Have you used any form of tobacco in the last 30 days? (Cigarettes, Smokeless Tobacco, Cigars, and/or Pipes): Yes Tobacco use, Select all that apply: 5 or more cigarettes per day Are you interested in Tobacco Cessation Medications?: Yes, will notify MD for an order Counseled patient on smoking cessation including recognizing danger situations, developing coping skills and basic information about quitting provided: Refused/Declined practical counseling Social History:  History  Alcohol Use No      History  Drug Use  . Types: Marijuana    Comment: denies [id number]    Additional Social History:                           Allergies:   Allergies  Allergen Reactions  . Fish Allergy    Lab Results:  Results for orders placed or performed during the hospital encounter of 07/20/16 (from the past 48 hour(s))  Lipid panel     Status: Abnormal   Collection Time: 07/21/16  7:05 AM  Result Value Ref Range   Cholesterol 161 0 - 200 mg/dL   Triglycerides 540 (H) <150 mg/dL   HDL 21 (L) >98 mg/dL   Total CHOL/HDL Ratio 7.7 RATIO   VLDL 43 (H) 0 - 40 mg/dL   LDL Cholesterol 97 0 - 99 mg/dL    Comment:        Total Cholesterol/HDL:CHD Risk Coronary Heart Disease Risk Table                     Men   Women  1/2 Average  Risk   3.4   3.3  Average Risk       5.0   4.4  2 X Average Risk   9.6   7.1  3 X Average Risk  23.4   11.0        Use the calculated Patient Ratio above and the CHD Risk Table to determine the patient's CHD Risk.        ATP III CLASSIFICATION (LDL):  <100     mg/dL   Optimal  [id number]  mg/dL   Near or Above                    Optimal  130-159  mg/dL   Borderline  [id number]  mg/dL   High  >295>190     mg/dL   Very High   TSH     Status: None   Collection Time: 07/21/16  7:05 AM  Result Value Ref Range   TSH 1.086 0.350 - 4.500 uIU/mL    Comment: Performed by a 3rd Generation assay with a functional sensitivity of <=0.01 uIU/mL.    Blood Alcohol level:  Lab Results  Component Value Date   ETH <5 07/19/2016   ETH <5 10/19/2015    Metabolic Disorder Labs:  No results found for: HGBA1C, MPG No results found for: PROLACTIN Lab Results  Component Value Date   CHOL 161 07/21/2016   TRIG 213 (H) 07/21/2016   HDL 21 (L) 07/21/2016   CHOLHDL 7.7 07/21/2016   VLDL 43 (H) 07/21/2016   LDLCALC 97 07/21/2016    Current Medications: Current Facility-Administered Medications  Medication Dose Route Frequency Provider Last Rate Last Dose  . acetaminophen  (TYLENOL) tablet 650 mg  650 mg Oral Q6H PRN Clapacs, John T, MD      . alum & mag hydroxide-simeth (MAALOX/MYLANTA) 200-200-20 MG/5ML suspension 30 mL  30 mL Oral Q4H PRN Clapacs, John T, MD      . carbamazepine (TEGRETOL) tablet 400 mg  400 mg Oral BID AC Clapacs, Jackquline DenmarkJohn T, MD   400 mg at 07/21/16 0823  . darunavir-cobicistat (PREZCOBIX) 800-150 MG per tablet 1 tablet  1 tablet Oral Q breakfast Clapacs, John T, MD      . emtricitabine-tenofovir AF (DESCOVY) 200-25 MG per tablet 1 tablet  1 tablet Oral Daily Clapacs, Jackquline DenmarkJohn T, MD   1 tablet at 07/21/16 [phone]  . levETIRAcetam (KEPPRA) tablet 1,500 mg  1,500 mg Oral BID Clapacs, John T, MD      . magnesium hydroxide (MILK OF MAGNESIA) suspension 30 mL  30 mL Oral Daily PRN Clapacs, Jackquline DenmarkJohn T, MD       PTA Medications: Prescriptions Prior to Admission  Medication Sig Dispense Refill Last Dose  . aspirin EC 81 MG tablet Take 81 mg by mouth daily.     . baclofen (LIORESAL) 10 MG tablet Take 10 mg by mouth 3 (three) times daily.     . carbamazepine (TEGRETOL) 200 MG tablet Take 1 tablet (200 mg total) by mouth 2 (two) times daily. (Patient taking differently: Take 400 mg by mouth 2 (two) times daily. ) 28 tablet 0   . darunavir-cobicistat (PREZCOBIX) 800-150 MG tablet Take 1 tablet by mouth daily with breakfast. Swallow whole. Do NOT crush, break or chew tablets. Take with food.     Marland Kitchen. emtricitabine-tenofovir AF (DESCOVY) 200-25 MG tablet Take 1 tablet by mouth daily.     . meloxicam (MOBIC) 15 MG tablet Take 1 tablet by mouth daily.     .Marland Kitchen  metoprolol succinate (TOPROL-XL) 50 MG 24 hr tablet Take 1 tablet (50 mg total) by mouth daily. 15 tablet 0     Musculoskeletal: Strength & Muscle Tone: within normal limits Gait & Station: normal Patient leans: N/A  Psychiatric Specialty Exam: Physical Exam  Nursing note and vitals reviewed. Constitutional: He is oriented to person, place, and time. He appears well-developed and well-nourished.  HENT:  Head:  Normocephalic and atraumatic.  Eyes: Pupils are equal, round, and reactive to light. Conjunctivae and EOM are normal.  Neck: Normal range of motion. Neck supple.  Cardiovascular: Normal rate, regular rhythm and normal heart sounds.   Respiratory: Effort normal and breath sounds normal.  GI: Soft. Bowel sounds are normal.  Musculoskeletal: Normal range of motion.  Neurological: He is alert and oriented to person, place, and time.  Skin: Skin is warm and dry.  Psychiatric: He has a normal mood and affect. His speech is normal and behavior is normal. Thought content normal. Cognition and memory are normal. He expresses impulsivity.    Review of Systems  Neurological: Positive for seizures.  Psychiatric/Behavioral: Positive for substance abuse.  All other systems reviewed and are negative.   Blood pressure (!) 150/83, pulse (!) 54, temperature 98.1 F (36.7 C), temperature source Oral, resp. rate 18, height 6\' 1"  (1.854 m), weight 133.4 kg (294 lb), SpO2 100 %.Body mass index is 38.79 kg/m.  General Appearance: Casual  Eye Contact:  Good  Speech:  Clear and Coherent  Volume:  Normal  Mood:  Euthymic  Affect:  Appropriate  Thought Process:  Goal Directed and Descriptions of Associations: Intact  Orientation:  Full (Time, Place, and Person)  Thought Content:  WDL  Suicidal Thoughts:  No  Homicidal Thoughts:  No  Memory:  Immediate;   Fair Recent;   Fair Remote;   Fair  Judgement:  Impaired  Insight:  Present  Psychomotor Activity:  Normal  Concentration:  Concentration: Fair and Attention Span: Fair  Recall:  [employer] of Knowledge:  Fair  Language:  Fair  Akathisia:  No  Handed:  Right  AIMS (if indicated):     Assets:  Communication Skills Desire for Improvement Housing Resilience Social Support  ADL's:  Intact  Cognition:  WNL  Sleep:  Number of Hours: 6.15    Treatment Plan Summary: Daily contact with patient to assess and evaluate symptoms and progress in  treatment and Medication management   Mr. Corvin is a 46 year old male with no past paychiatric history admitted after overdose on blood pressure medication in the context of marital discord.  1. Suicidal ideation. The patient adamantly denies any thoughts, intention, or plans to hurt himself or others. He is able to contract for safety. He is forward thinking and optimistic about the future. He is a loving father and grandfather 6 grandchildren.  2. Mood. The patient denies any symptoms of depression and is not interested in pharmacotherapy. He is interested in faith counseling following discharge.  3. Cannabis abuse. The patient minimizes his problems but is interested in substance abuse treatment.  4. Hypertension. He is on aspirin Toprol. We have Toprol due to overdose. His blood pressure is again elevated.  5. Seizure. We continued Tegretol.  6. HIV. We continued antiretrovirals.  7. Disposition. He will be discharged with his mother. He will follow up with [facility] in [place] as he will spend few months with his uncle there.   Observation Level/Precautions:  15 minute checks  Laboratory:  CBC Chemistry Profile UDS UA  Psychotherapy:    Medications:    Consultations:    Discharge Concerns:    Estimated LOS:  Other:     Physician Treatment Plan for Primary Diagnosis: Adjustment disorder with mixed anxiety and depressed mood Long Term Goal(s): Improvement in symptoms so as ready for discharge  Short Term Goals: Ability to identify changes in lifestyle to reduce recurrence of condition will improve, Ability to verbalize feelings will improve, Ability to disclose and discuss suicidal ideas, Ability to demonstrate self-control will improve, Ability to identify and develop effective coping behaviors will improve and Ability to identify triggers associated with substance abuse/mental health issues will improve  Physician Treatment Plan for Secondary Diagnosis: Principal Problem:    Adjustment disorder with mixed anxiety and depressed mood Active Problems:   Suicide attempt by beta blocker overdose (HCC)   HIV (human immunodeficiency virus infection) (HCC)   Seizures (HCC)   HTN (hypertension)   Cannabis use disorder, moderate, dependence (HCC)   Tobacco use disorder  Long Term Goal(s): Improvement in symptoms so as ready for discharge  Short Term Goals: Ability to identify changes in lifestyle to reduce recurrence of condition will improve, Ability to demonstrate self-control will improve and Ability to identify triggers associated with substance abuse/mental health issues will improve  I certify that inpatient services furnished can reasonably be expected to improve the patient's condition.    Kristine Linea, MD 7/13/201810:50 AM

## 2016-07-21 NOTE — Progress Notes (Signed)
  [facility] :  Will you be returning to the same living situation after discharge:  No. [place]. At discharge, do you have transportation home?: No. Bus pass provided. Do you have the ability to pay for your medications: No. No medication prescribed.  Pt already set up to receive medical meds.  Release of information consent forms completed and in the chart;  Patient's signature needed at discharge.  Patient to Follow up at: Follow-up Information    [facility]. Go in 7 day(s).   Why:  [facility] during walk in hours, Monday-Friday, 8:00am-5:00pm.  Please bring a copy of your hospital discharge paperwork. Contact information: [address] [place], [place] [address] P: [phone] F: [phone]          Next level of care provider has [facility] to [facility] Link:no  Safety Planning and Suicide Prevention discussed: No. Attempts made.  Have you used any form of tobacco in the last 30 days? (Cigarettes, Smokeless Tobacco, Cigars, and/or Pipes): Yes  Has patient been referred to the Quitline?: Patient refused referral  Patient has been referred for addiction treatment: Yes  Lorri FrederickWierda, Mystie Ormand Jon, LCSW 07/21/2016, 11:28 AM

## 2016-07-21 NOTE — [facility] Counselor (Signed)
No PSA completed for this pt as he was on the unit for less than 24 hours. Garner NashGregory Arrayah Connors, MSW, LCSW Clinical Social Worker 07/21/2016 12:04 PM

## 2016-07-21 NOTE — [facility] Group Notes (Signed)
[facility] LCSW Group Therapy  07/21/2016 10:55 AM  Type of Therapy:  Group Therapy  Participation Level:  Patient did not attend group. CSW invited patient to group.   Summary of Progress/Problems: Feelings around Relapse. Group members discussed the meaning of relapse and shared personal stories of relapse, how it affected them and others, and how they perceived themselves during this time. Group members were encouraged to identify triggers, warning signs and coping skills used when facing the possibility of relapse. Social supports were discussed and explored in detail. Patients also discussed facing disappointment and how that can trigger someone to relapse.  Kaetlin Bullen G. Garnette CzechSampson MSW, LCSWA 07/21/2016, 10:55 AM

## 2016-07-21 NOTE — Progress Notes (Signed)
Patient ID: Victor Scott, male   DOB: Feb 09, 1970, 46 y.o.   MRN: [id number] Rough looking appearance, "Call me Bart; I came in depressed, I took my blood pressure medications to kill my self; my wife is leaving me; I used to work at [employer] in [place], had a seizure at work in October last year, rushed to the hospital, the doctor, while in the hospital, disclosed my HIV status/condition and my wife was upset, disappointed, surprised and almost committed suicide herself. I did not tell her!... I was in prison for 21 and half years, I messed around with boys on the streets ..." Patient was given time, enough, to express feelings and understands that I have to see the rest of my patients. Denies SI/HI/AVH now.

## 2016-07-21 NOTE — Progress Notes (Signed)
Patient was visible on the unit at times sitting in dayroom watching TV or talking with select peers. Patient in the morning appeared a little irritable, but patient was slightly agitated about his medication because he stated he don't need two type of seizure medication and the other one he just doesn't take. Patient denies any SI/HI/AH/VH. Patient is alert and oriented x 4, breathing unlabored, and extremities x 4 within normal limits. Patient is calm and cooperative. Patient did not display any disruptive behavior. Patient interacted with staff and peers in dayroom and hallway. Patient continues to be monitored on 15 minute safety checks. Patient was excited about discharge today. Patient received all his personal belongings, discharge instructions, and medication prescription and verbalized  Understanding. RN walked with patient to the courtsey car to the bus stop at 1:10 pm.

## 2016-07-21 NOTE — [facility] Suicide Risk Assessment (Signed)
[facility] INPATIENT:  Family/Significant Other Suicide Prevention Education  Suicide Prevention Education:  Contact Attempts: Victor DexterMichael Scott, uncle, [phone], has been identified by the patient as the family member/significant other with whom the patient will be residing, and identified as the person(s) who will aid the patient in the event of a mental health crisis.  With written consent from the patient, two attempts were made to provide suicide prevention education, prior to and/or following the patient's discharge.  We were unsuccessful in providing suicide prevention education.  A suicide education pamphlet was given to the patient to share with family/significant other.  Date and time of first attempt:07/21/16, 1125 Date and time of second attempt:  Victor FrederickWierda, Victor Hassing Jon, LCSW 07/21/2016, 11:27 AM

## 2016-07-21 NOTE — Discharge Summary (Signed)
Physician Discharge Summary Note  Patient:  Victor Scott is an 46 y.o., male MRN:  [id number] DOB:  1970/02/06 Patient phone:  [phone] (home)  Patient address:   [address] [place] [address],  Total Time spent with patient: 1 hour  Date of Admission:  07/20/2016 Date of Discharge: 07/21/2016  Reason for Admission:  Overdose.  Identifying data. Victor Scott is a 46 year old male with no past psychiatric history.  Chief complaint. "I was trying to get my wife's attention."  History of present illness. Information was obtained from the patient's chart. For the past 2 months the patient has been experiencing marital problems and has been separated from his wife. On the day of admission he overdosed on metoprolol that is prescribed to him for hypertension but immediately called a friend and was brought to the hospital. He denies suicidal intention and explained that he was trying to get his wife's attention. Indeed she visited him in the emergency room. The patient realizes that he has been less than ideal husband, not wanting to talk to his wife about these problems, and withholding self-critical health information from her. During the past 2 months, he did a lot of soul searching, started reading the Bible, but connected with his church friends and decided that he would do what is necessary to reveal his marriage. He denies symptoms of depression, anxiety, psychosis or symptoms suggestive of bipolar disorder. This overdose was impulsive and did not have prior suicidal thoughts he smokes marijuana regularly but denies alcohol, illicit substance, or prescription pill abuse.  Past psychiatric history. He has never been hospitalized, treated for mental illness, or attempted suicide. He has past history of cocaine use but has been clean of heart substances for years. He has been in prison several times for a total of 21 years beginning at the age of 29. He has never been in mental health  while incarcerated.  Family psychiatric history. His father and 2 brothers died of alcoholism.  Social history. He has been married for over a year ago to a woman is known for several years. He was not forthcoming about his HIV status with her and it created a rift as she has learned about it from Victor Scott when the patient was hospitalized for seizures in the fall. He has been seizure-free for 6 months and is not allowed to return to work. 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 It is difficult for him to get a job given his criminal past. He has a son who is 83 years old and has 6 grandchildren. There are no current legal charges pending.  Principal Problem: Adjustment disorder with mixed anxiety and depressed mood Discharge Diagnoses: Patient Active Problem List   Diagnosis Date Noted  . HTN (hypertension) [I10] 07/21/2016  . Cannabis use disorder, moderate, dependence (HCC) [F12.20] 07/21/2016  . Tobacco use disorder [F17.200] 07/21/2016  . Adjustment disorder with mixed anxiety and depressed mood [F43.23] 07/20/2016  . Suicide attempt by beta blocker overdose (HCC) [T44.7X2A] 07/19/2016  . HIV (human immunodeficiency virus infection) (HCC) [B20] 07/19/2016  . Seizures (HCC) [R56.9] 07/19/2016  . Acute encephalopathy [G93.40] 10/19/2015    Past Medical History:  Past Medical History:  Diagnosis Date  . HIV (human immunodeficiency virus infection) (HCC)   . Hypertension   . Seizures (HCC)     Past Surgical History:  Procedure Laterality Date  . none     Family History:  Family History  Problem Relation Age of Onset  .  Hypertension Father    Social History:  History  Alcohol Use No     History  Drug Use  . Types: Marijuana    Comment: denies [id number]    Social History   Social History  . Marital status: Married    Spouse name: N/A  . Number of children: N/A  . Years of education: N/A   Social History Main Topics  . Smoking status: Current  Every Day Smoker    Packs/day: 1.00    Types: Cigarettes  . Smokeless tobacco: Never Used  . Alcohol use No  . Drug use: Yes    Types: Marijuana     Comment: denies [id number]  . Sexual activity: Not Asked   Other Topics Concern  . None   Social History Narrative  . None    Hospital Course:    Victor Scott is a 46 year old male with no past paychiatric history admitted after overdose on blood pressure medication in the context of marital discord.  1. Suicidal ideation. The patient adamantly denies any thoughts, intention, or plans to hurt himself or others. He is able to contract for safety. He is forward thinking and optimistic about the future. He is a loving father and grandfather 6 grandchildren.  2. Mood. The patient denies any symptoms of depression and is not interested in pharmacotherapy. He is interested in faith based counseling following discharge.  3. Cannabis abuse. The patient minimizes his problems but is interested in substance abuse treatment.  4. Hypertension. He is on aspirin and Toprol. We held Toprol due to overdose. His blood pressure is again elevated.  5. Seizure. We continued Tegretol and Keppra.  6. HIV. We continued antiretrovirals.  7. Smoking. Nicotine patch was available.   8. Disposition. He will be discharged with his mother. He will follow up with [facility] in [place] as he will spend few months with his uncle there for therapy, and with his neurologist and ID provider.  Physical Findings: AIMS:  , ,  ,  ,    CIWA:    COWS:     Musculoskeletal: Strength & Muscle Tone: within normal limits Gait & Station: normal Patient leans: N/A  Psychiatric Specialty Exam: Physical Exam  Nursing note and vitals reviewed. Psychiatric: He has a normal mood and affect. His speech is normal and behavior is normal. Thought content normal. Cognition and memory are normal. He expresses impulsivity.    Review of Systems  Neurological: Positive for  seizures.  Psychiatric/Behavioral: Positive for substance abuse.  All other systems reviewed and are negative.   Blood pressure (!) 150/83, pulse (!) 54, temperature 98.1 F (36.7 C), temperature source Oral, resp. rate 18, height 6\' 1"  (1.854 m), weight 133.4 kg (294 lb), SpO2 100 %.Body mass index is 38.79 kg/m.  General Appearance: Casual  Eye Contact:  Good  Speech:  Clear and Coherent  Volume:  Normal  Mood:  Euthymic  Affect:  Appropriate  Thought Process:  Goal Directed and Descriptions of Associations: Intact  Orientation:  Full (Time, Place, and Person)  Thought Content:  WDL  Suicidal Thoughts:  No  Homicidal Thoughts:  No  Memory:  Immediate;   Fair Recent;   Fair Remote;   Fair  Judgement:  Impaired  Insight:  Present  Psychomotor Activity:  Normal  Concentration:  Concentration: Fair and Attention Span: Fair  Recall:  [employer] of Knowledge:  Fair  Language:  Fair  Akathisia:  No  Handed:  Right  AIMS (if indicated):  Assets:  Communication Skills Desire for Improvement Housing Resilience Social Support  ADL's:  Intact  Cognition:  WNL  Sleep:  Number of Hours: 6.15     Have you used any form of tobacco in the last 30 days? (Cigarettes, Smokeless Tobacco, Cigars, and/or Pipes): Yes  Has this patient used any form of tobacco in the last 30 days? (Cigarettes, Smokeless Tobacco, Cigars, and/or Pipes) Yes, Yes, A prescription for an FDA-approved tobacco cessation medication was offered at discharge and the patient refused  Blood Alcohol level:  Lab Results  Component Value Date   [facility] 07/19/2016   ETH <5 10/19/2015    Metabolic Disorder Labs:  No results found for: HGBA1C, MPG No results found for: PROLACTIN Lab Results  Component Value Date   CHOL 161 07/21/2016   TRIG 213 (H) 07/21/2016   HDL 21 (L) 07/21/2016   CHOLHDL 7.7 07/21/2016   VLDL 43 (H) 07/21/2016   LDLCALC 97 07/21/2016    See Psychiatric Specialty Exam and Suicide Risk  Assessment completed by Attending Physician prior to discharge.  Discharge destination:  Home  Is patient on multiple antipsychotic therapies at discharge:  No   Has Patient had three or more failed trials of antipsychotic monotherapy by history:  No  Recommended Plan for Multiple Antipsychotic Therapies: NA  Discharge Instructions    Diet - low sodium heart healthy    Complete by:  As directed    Increase activity slowly    Complete by:  As directed      Allergies as of 07/21/2016      Reactions   Fish Allergy       Medication List    TAKE these medications     Indication  aspirin EC 81 MG tablet Take 81 mg by mouth daily.  Indication:  Inflammation   baclofen 10 MG tablet Commonly known as:  LIORESAL Take 10 mg by mouth 3 (three) times daily.  Indication:  Muscle Spasticity   carbamazepine 200 MG tablet Commonly known as:  TEGRETOL Take 2 tablets (400 mg total) by mouth 2 (two) times daily before a meal. What changed:  how much to take  when to take this  Indication:  Tonic-Clonic Seizures   emtricitabine-tenofovir AF 200-25 MG tablet Commonly known as:  DESCOVY Take 1 tablet by mouth daily.  Indication:  HIV Disease   levETIRAcetam 750 MG tablet Commonly known as:  KEPPRA Take 2 tablets (1,500 mg total) by mouth 2 (two) times daily.  Indication:  Tonic-Clonic Seizures   meloxicam 15 MG tablet Commonly known as:  MOBIC Take 1 tablet by mouth daily.  Indication:  Joint Damage causing Pain and Loss of Function   metoprolol succinate 50 MG 24 hr tablet Commonly known as:  TOPROL-XL Take 1 tablet (50 mg total) by mouth daily.  Indication:  High Blood Pressure Disorder   PREZCOBIX 800-150 MG tablet Generic drug:  darunavir-cobicistat Take 1 tablet by mouth daily with breakfast. Swallow whole. Do NOT crush, break or chew tablets. Take with food.  Indication:  HIV Disease        Follow-up recommendations:  Activity:  as tolerated. Diet:  low sodium  heart healthy. Other:  keep follow up appointments.  Comments:    Signed: Kristine LineaJolanta Evie Crumpler, MD 07/21/2016, 11:10 AM

## 2016-07-21 NOTE — Tx Team (Signed)
Interdisciplinary Treatment and Diagnostic Plan Update  07/21/2016 Time of Session: 1030 Chevez Sambrano MRN: [id number]  Principal Diagnosis: Adjustment disorder with mixed anxiety and depressed mood  Secondary Diagnoses: Principal Problem:   Adjustment disorder with mixed anxiety and depressed mood Active Problems:   Suicide attempt by beta blocker overdose (HCC)   HIV (human immunodeficiency virus infection) (HCC)   Seizures (HCC)   HTN (hypertension)   Cannabis use disorder, moderate, dependence (HCC)   Tobacco use disorder   Current Medications:  Current Facility-Administered Medications  Medication Dose Route Frequency Provider Last Rate Last Dose  . acetaminophen (TYLENOL) tablet 650 mg  650 mg Oral Q6H PRN Clapacs, John T, MD      . alum & mag hydroxide-simeth (MAALOX/MYLANTA) 200-200-20 MG/5ML suspension 30 mL  30 mL Oral Q4H PRN Clapacs, John T, MD      . carbamazepine (TEGRETOL) tablet 400 mg  400 mg Oral BID AC Clapacs, Jackquline Denmark, MD   400 mg at 07/21/16 0823  . darunavir-cobicistat (PREZCOBIX) 800-150 MG per tablet 1 tablet  1 tablet Oral Q breakfast Clapacs, John T, MD      . emtricitabine-tenofovir AF (DESCOVY) 200-25 MG per tablet 1 tablet  1 tablet Oral Daily Clapacs, Jackquline Denmark, MD   1 tablet at 07/21/16 [id number]  . levETIRAcetam (KEPPRA) tablet 1,500 mg  1,500 mg Oral BID Clapacs, John T, MD      . magnesium hydroxide (MILK OF MAGNESIA) suspension 30 mL  30 mL Oral Daily PRN Clapacs, Jackquline Denmark, MD       Current Outpatient Prescriptions  Medication Sig Dispense Refill  . aspirin EC 81 MG tablet Take 81 mg by mouth daily.    . baclofen (LIORESAL) 10 MG tablet Take 10 mg by mouth 3 (three) times daily.    . carbamazepine (TEGRETOL) 200 MG tablet Take 2 tablets (400 mg total) by mouth 2 (two) times daily before a meal. 120 tablet 1  . darunavir-cobicistat (PREZCOBIX) 800-150 MG tablet Take 1 tablet by mouth daily with breakfast. Swallow whole. Do NOT crush, break or chew  tablets. Take with food.    Marland Kitchen emtricitabine-tenofovir AF (DESCOVY) 200-25 MG tablet Take 1 tablet by mouth daily.    Marland Kitchen levETIRAcetam (KEPPRA) 750 MG tablet Take 2 tablets (1,500 mg total) by mouth 2 (two) times daily. 120 tablet 1  . meloxicam (MOBIC) 15 MG tablet Take 1 tablet by mouth daily.    . metoprolol succinate (TOPROL-XL) 50 MG 24 hr tablet Take 1 tablet (50 mg total) by mouth daily. 15 tablet 0   PTA Medications: No prescriptions prior to admission.    Patient Stressors: Financial difficulties Marital or family conflict Substance abuse  Patient Strengths: Ability for insight Active sense of humor Average or above average intelligence Capable of independent living Communication skills Supportive family/friends  Treatment Modalities: Medication Management, Group therapy, Case management,  1 to 1 session with clinician, Psychoeducation, Recreational therapy.   Physician Treatment Plan for Primary Diagnosis: Adjustment disorder with mixed anxiety and depressed mood Long Term Goal(s): Improvement in symptoms so as ready for discharge Improvement in symptoms so as ready for discharge   Short Term Goals: Ability to identify changes in lifestyle to reduce recurrence of condition will improve Ability to verbalize feelings will improve Ability to disclose and discuss suicidal ideas Ability to demonstrate self-control will improve Ability to identify and develop effective coping behaviors will improve Ability to identify triggers associated with substance abuse/mental health issues will improve Ability to identify  changes in lifestyle to reduce recurrence of condition will improve Ability to demonstrate self-control will improve Ability to identify triggers associated with substance abuse/mental health issues will improve  Medication Management: Evaluate patient's response, side effects, and tolerance of medication regimen.  Therapeutic Interventions: 1 to 1 sessions, Unit  Group sessions and Medication administration.  Evaluation of Outcomes: Adequate for Discharge  Physician Treatment Plan for Secondary Diagnosis: Principal Problem:   Adjustment disorder with mixed anxiety and depressed mood Active Problems:   Suicide attempt by beta blocker overdose (HCC)   HIV (human immunodeficiency virus infection) (HCC)   Seizures (HCC)   HTN (hypertension)   Cannabis use disorder, moderate, dependence (HCC)   Tobacco use disorder  Long Term Goal(s): Improvement in symptoms so as ready for discharge Improvement in symptoms so as ready for discharge   Short Term Goals: Ability to identify changes in lifestyle to reduce recurrence of condition will improve Ability to verbalize feelings will improve Ability to disclose and discuss suicidal ideas Ability to demonstrate self-control will improve Ability to identify and develop effective coping behaviors will improve Ability to identify triggers associated with substance abuse/mental health issues will improve Ability to identify changes in lifestyle to reduce recurrence of condition will improve Ability to demonstrate self-control will improve Ability to identify triggers associated with substance abuse/mental health issues will improve     Medication Management: Evaluate patient's response, side effects, and tolerance of medication regimen.  Therapeutic Interventions: 1 to 1 sessions, Unit Group sessions and Medication administration.  Evaluation of Outcomes: Adequate for Discharge   RN Treatment Plan for Primary Diagnosis: Adjustment disorder with mixed anxiety and depressed mood Long Term Goal(s): Knowledge of disease and therapeutic regimen to maintain health will improve  Short Term Goals: Ability to disclose and discuss suicidal ideas  Medication Management: RN will administer medications as ordered by provider, will assess and evaluate patient's response and provide education to patient for prescribed  medication. RN will report any adverse and/or side effects to prescribing provider.  Therapeutic Interventions: 1 on 1 counseling sessions, Psychoeducation, Medication administration, Evaluate responses to treatment, Monitor vital signs and CBGs as ordered, Perform/monitor CIWA, COWS, AIMS and Fall Risk screenings as ordered, Perform wound care treatments as ordered.  Evaluation of Outcomes: Adequate for Discharge   LCSW Treatment Plan for Primary Diagnosis: Adjustment disorder with mixed anxiety and depressed mood Long Term Goal(s): Safe transition to appropriate next level of care at discharge, Engage patient in therapeutic group addressing interpersonal concerns.  Short Term Goals: Engage patient in aftercare planning with referrals and resources  Therapeutic Interventions: Assess for all discharge needs, 1 to 1 time with Social worker, Explore available resources and support systems, Assess for adequacy in community support network, Educate family and significant other(s) on suicide prevention, Complete Psychosocial Assessment, Interpersonal group therapy.  Evaluation of Outcomes: Adequate for Discharge   Progress in Treatment: Attending groups: No. Participating in groups: No. Taking medication as prescribed: Yes. Toleration medication: Yes. Family/Significant other contact made: No, will contact:  attempts made Patient understands diagnosis: Yes. Discussing patient identified problems/goals with staff: Yes. Medical problems stabilized or resolved: Yes. Denies suicidal/homicidal ideation: Yes. Issues/concerns per patient self-inventory: No. Other: none  New problem(s) identified: No, Describe:  none  New Short Term/Long Term Goal(s): Pt goal is to discharge today.  Discharge Plan or Barriers: Pt will follow [place] with [facility] [place].  Reason for Continuation of Hospitalization: Other; describe none, discharge today.  Estimated Length of Stay: discharge  today.  Attendees: Patient: Nyxon Strupp 07/21/2016   Physician: Dr. Jennet Maduro, MD 07/21/2016   Nursing: Leonia Reader, RN 07/21/2016   RN Care Manager: 07/21/2016   Social Worker: Daleen Squibb, LCSW 07/21/2016   Recreational Therapist: Hershal Coria, LRT/CTRS  07/21/2016   Other:  07/21/2016   Other:  07/21/2016   Other: 07/21/2016     Scribe for Treatment Team: Lorri Frederick, LCSW 07/21/2016 2:33 PM

## 2016-07-22 ENCOUNTER — Emergency Department
Admission: EM | Admit: 2016-07-22 | Discharge: 2016-07-22 | Disposition: A | Discharge status: Other Acute Inpatient Hospital | Payer: Self-pay | Attending: Emergency Medicine | Admitting: Emergency Medicine

## 2016-07-22 ENCOUNTER — Encounter: Payer: Self-pay | Admitting: *Deleted

## 2016-07-22 ENCOUNTER — Inpatient Hospital Stay
Admission: AD | Admit: 2016-07-22 | Discharge: 2016-07-28 | DRG: 885 | Disposition: A | Discharge status: Home / Self Care | Payer: No Typology Code available for payment source | Attending: Psychiatry | Admitting: Psychiatry

## 2016-07-22 DIAGNOSIS — F1220 Cannabis dependence, uncomplicated: Secondary | ICD-10-CM | POA: Diagnosis present

## 2016-07-22 DIAGNOSIS — B20 Human immunodeficiency virus [HIV] disease: Secondary | ICD-10-CM | POA: Insufficient documentation

## 2016-07-22 DIAGNOSIS — F4323 Adjustment disorder with mixed anxiety and depressed mood: Secondary | ICD-10-CM | POA: Diagnosis not present

## 2016-07-22 DIAGNOSIS — Z7982 Long term (current) use of aspirin: Secondary | ICD-10-CM | POA: Diagnosis not present

## 2016-07-22 DIAGNOSIS — Z91013 Allergy to seafood: Secondary | ICD-10-CM

## 2016-07-22 DIAGNOSIS — G4089 Other seizures: Secondary | ICD-10-CM | POA: Diagnosis present

## 2016-07-22 DIAGNOSIS — F17210 Nicotine dependence, cigarettes, uncomplicated: Secondary | ICD-10-CM | POA: Insufficient documentation

## 2016-07-22 DIAGNOSIS — I10 Essential (primary) hypertension: Secondary | ICD-10-CM | POA: Insufficient documentation

## 2016-07-22 DIAGNOSIS — M549 Dorsalgia, unspecified: Secondary | ICD-10-CM | POA: Diagnosis present

## 2016-07-22 DIAGNOSIS — G4700 Insomnia, unspecified: Secondary | ICD-10-CM | POA: Diagnosis present

## 2016-07-22 DIAGNOSIS — R45851 Suicidal ideations: Secondary | ICD-10-CM | POA: Diagnosis present

## 2016-07-22 DIAGNOSIS — Z79899 Other long term (current) drug therapy: Secondary | ICD-10-CM | POA: Insufficient documentation

## 2016-07-22 DIAGNOSIS — F332 Major depressive disorder, recurrent severe without psychotic features: Secondary | ICD-10-CM | POA: Diagnosis present

## 2016-07-22 DIAGNOSIS — R569 Unspecified convulsions: Secondary | ICD-10-CM

## 2016-07-22 DIAGNOSIS — F17200 Nicotine dependence, unspecified, uncomplicated: Secondary | ICD-10-CM | POA: Diagnosis present

## 2016-07-22 HISTORY — DX: Major depressive disorder, single episode, unspecified: F32.9

## 2016-07-22 HISTORY — DX: Depression, unspecified: F32.A

## 2016-07-22 LAB — SALICYLATE LEVEL

## 2016-07-22 LAB — URINE DRUG SCREEN, QUALITATIVE (ARMC ONLY)
Amphetamines, Ur Screen: NOT DETECTED
BENZODIAZEPINE, UR SCRN: NOT DETECTED
Barbiturates, Ur Screen: NOT DETECTED
CANNABINOID 50 NG, UR ~~LOC~~: POSITIVE — AB
Cocaine Metabolite,Ur ~~LOC~~: NOT DETECTED
MDMA (Ecstasy)Ur Screen: NOT DETECTED
Methadone Scn, Ur: NOT DETECTED
Opiate, Ur Screen: NOT DETECTED
PHENCYCLIDINE (PCP) UR S: NOT DETECTED
TRICYCLIC, UR SCREEN: NOT DETECTED

## 2016-07-22 LAB — COMPREHENSIVE METABOLIC PANEL
ALT: 47 U/L (ref 17–63)
AST: 30 U/L (ref 15–41)
Albumin: 4.6 g/dL (ref 3.5–5.0)
Alkaline Phosphatase: 60 U/L (ref 38–126)
Anion gap: 11 (ref 5–15)
BILIRUBIN TOTAL: 0.8 mg/dL (ref 0.3–1.2)
BUN: 8 mg/dL (ref 6–20)
CHLORIDE: 109 mmol/L (ref 101–111)
CO2: 23 mmol/L (ref 22–32)
CREATININE: 1.01 mg/dL (ref 0.61–1.24)
Calcium: 9.1 mg/dL (ref 8.9–10.3)
Glucose, Bld: 109 mg/dL — ABNORMAL HIGH (ref 65–99)
POTASSIUM: 3.4 mmol/L — AB (ref 3.5–5.1)
Sodium: 143 mmol/L (ref 135–145)
TOTAL PROTEIN: 8.4 g/dL — AB (ref 6.5–8.1)

## 2016-07-22 LAB — CBC
HCT: 47.1 % (ref 40.0–52.0)
Hemoglobin: 15.3 g/dL (ref 13.0–18.0)
MCH: 24.3 pg — AB (ref 26.0–34.0)
MCHC: 32.5 g/dL (ref 32.0–36.0)
MCV: 74.7 fL — AB (ref 80.0–100.0)
PLATELETS: 207 10*3/uL (ref 150–440)
RBC: 6.3 MIL/uL — ABNORMAL HIGH (ref 4.40–5.90)
RDW: 15.8 % — AB (ref 11.5–14.5)
WBC: 6 10*3/uL (ref 3.8–10.6)

## 2016-07-22 LAB — ETHANOL

## 2016-07-22 LAB — HEMOGLOBIN A1C
HEMOGLOBIN A1C: 5.3 % (ref 4.8–5.6)
MEAN PLASMA GLUCOSE: 105 mg/dL

## 2016-07-22 LAB — ACETAMINOPHEN LEVEL: Acetaminophen (Tylenol), Serum: <10 ug/mL — ABNORMAL LOW (ref 10–30)

## 2016-07-22 MED ORDER — LEVETIRACETAM 500 MG PO TABS
1500.0000 mg | ORAL_TABLET | Freq: Two times a day (BID) | ORAL | Status: DC
  Filled 2016-07-22: qty 3

## 2016-07-22 MED ORDER — CARBAMAZEPINE 200 MG PO TABS: 400.0000 mg | ORAL_TABLET | Freq: Two times a day (BID) | ORAL | Status: DC

## 2016-07-22 MED ORDER — EMTRICITABINE-TENOFOVIR AF 200-25 MG PO TABS
1.0000 | ORAL_TABLET | Freq: Every day | ORAL | Status: DC
  Administered 2016-07-24 – 2016-07-28 (×5): 1 via ORAL
  Filled 2016-07-22 (×6): qty 1

## 2016-07-22 MED ORDER — ACETAMINOPHEN 325 MG PO TABS: 650.0000 mg | ORAL_TABLET | Freq: Four times a day (QID) | ORAL | Status: DC | PRN

## 2016-07-22 MED ORDER — METOPROLOL SUCCINATE ER 50 MG PO TB24
50.0000 mg | ORAL_TABLET | Freq: Every day | ORAL | Status: DC
  Filled 2016-07-22: qty 1

## 2016-07-22 MED ORDER — DARUNAVIR-COBICISTAT 800-150 MG PO TABS
1.0000 | ORAL_TABLET | Freq: Every day | ORAL | Status: DC
Start: 2016-07-22 — End: 2016-07-22

## 2016-07-22 MED ORDER — CARBAMAZEPINE 200 MG PO TABS
400.0000 mg | ORAL_TABLET | Freq: Two times a day (BID) | ORAL | Status: DC
  Administered 2016-07-23 – 2016-07-28 (×10): 400 mg via ORAL
  Filled 2016-07-22 (×10): qty 2

## 2016-07-22 MED ORDER — HYDROXYZINE HCL 25 MG PO TABS: 25.0000 mg | ORAL_TABLET | Freq: Three times a day (TID) | ORAL | Status: DC | PRN

## 2016-07-22 MED ORDER — LEVETIRACETAM 500 MG PO TABS
1500.0000 mg | ORAL_TABLET | Freq: Two times a day (BID) | ORAL | Status: DC
  Filled 2016-07-22 (×2): qty 3

## 2016-07-22 MED ORDER — TRAZODONE HCL 50 MG PO TABS: 50.0000 mg | ORAL_TABLET | Freq: Every evening | ORAL | Status: DC | PRN

## 2016-07-22 MED ORDER — BACLOFEN 10 MG PO TABS
10.0000 mg | ORAL_TABLET | Freq: Three times a day (TID) | ORAL | Status: DC
  Administered 2016-07-23 – 2016-07-24 (×2): 10 mg via ORAL
  Filled 2016-07-22 (×12): qty 1

## 2016-07-22 MED ORDER — ASPIRIN EC 81 MG PO TBEC
81.0000 mg | DELAYED_RELEASE_TABLET | Freq: Every day | ORAL | Status: DC
  Administered 2016-07-24 – 2016-07-28 (×5): 81 mg via ORAL
  Filled 2016-07-22 (×6): qty 1

## 2016-07-22 MED ORDER — EMTRICITABINE-TENOFOVIR AF 200-25 MG PO TABS
1.0000 | ORAL_TABLET | Freq: Every day | ORAL | Status: DC
Start: 2016-07-22 — End: 2016-07-22

## 2016-07-22 MED ORDER — ASPIRIN EC 81 MG PO TBEC
81.0000 mg | DELAYED_RELEASE_TABLET | Freq: Every day | ORAL | Status: DC
  Filled 2016-07-22: qty 1

## 2016-07-22 MED ORDER — DARUNAVIR-COBICISTAT 800-150 MG PO TABS
1.0000 | ORAL_TABLET | Freq: Every day | ORAL | Status: DC
  Filled 2016-07-22 (×4): qty 1

## 2016-07-22 MED ORDER — METOPROLOL SUCCINATE ER 25 MG PO TB24
50.0000 mg | ORAL_TABLET | Freq: Every day | ORAL | Status: DC
  Administered 2016-07-24 – 2016-07-28 (×5): 50 mg via ORAL
  Filled 2016-07-22 (×6): qty 2

## 2016-07-22 MED ORDER — ALUM & MAG HYDROXIDE-SIMETH 200-200-20 MG/5ML PO SUSP: 30.0000 mL | ORAL | Status: DC | PRN

## 2016-07-22 MED ORDER — MAGNESIUM HYDROXIDE 400 MG/5ML PO SUSP: 30.0000 mL | Freq: Every day | ORAL | Status: DC | PRN

## 2016-07-22 MED ORDER — BACLOFEN 10 MG PO TABS
10.0000 mg | ORAL_TABLET | Freq: Three times a day (TID) | ORAL | Status: DC
  Filled 2016-07-22 (×2): qty 1

## 2016-07-22 NOTE — ED Notes (Signed)
[place]. Pt states he was texting a woman regarding "some pootang" when the police came and picked him up. Pt states his wife told police he was texting her stating he was going to hand himself. Pt denies statements. Pt states he does not feel suicidal or homicidal at this time. Pt states "the doctor told me yesterday when they let me out of here I don't need to be here." pt cooperative, ambulatory without difficulty.

## 2016-07-22 NOTE — ED Provider Notes (Signed)
-----------------------------------------   10:05 AM on 07/22/2016 -----------------------------------------   Blood pressure (!) 148/103, pulse 67, temperature 98.2 F (36.8 C), temperature source Oral, resp. rate 15, height 6\' 1"  (1.854 m), weight (!) 149.7 kg (330 lb), SpO2 100 %.  The patient had no acute events since last update.  Calm and cooperative at this time.    Patient saying that he is not having any suicidal or homicidal ideation and that he would like to be discharged so he could spend time with his grandchildren this weekend. He had an original specialist on call consultation recommending inpatient throughout the night. He has had a period of observation has been cooperative. We will have specialist on-call reassess.    Myrna BlazerSchaevitz, David Matthew, MD 07/22/16 1006

## 2016-07-22 NOTE — ED Notes (Signed)
[facility] due to emergent consults. Pt informed that he would be next on the list. Pt upset because he feels as if he is being "lied to". RN assured pt that we are not lying to him and that we will get in consult completed as soon as possible.

## 2016-07-22 NOTE — ED Notes (Signed)
Pt continues to be upset that he has not had his 2nd consult. Pt calm and cooperative.

## 2016-07-22 NOTE — BH Assessment (Signed)
Per the recommendation of SOC, patient meets inpatient criteria. Ad[facility] Rounding Physician Dr. Joseph ArtSubedi. Attending Physician will be Dr. Ardyth HarpsHernandez.   Patient has been [facility] to room 320, [facility] Nurse ScrevenGwen F.   Intake Paper Work has been signed and placed on patient chart.  ER staff is aware of the admission Christen Bame(Ronnie, ER Sect.; Dr. Derrill KayGoodman, ER MD & Marylene LandAngela. Patient Access).

## 2016-07-22 NOTE — ED Notes (Signed)
Report called to Amy, RN in BMU.

## 2016-07-22 NOTE — Progress Notes (Signed)
Multiple psychiatric admissions for this 46 yo MBM here on an IVC referred by ES in ED for text to his wife about hanging himself.  Pt reports this is not true and he was suppose to be d/c'd in the am and is upset he is here.  Reports he was just here yesterday and was discharged.  Pt denies s/i, h/i or hallucinations.  Reports it is a mistake that he was admitted.  Reports he will not take any medications while he is here.  He has hx of HTN, HIV+, seizure disorder and osteoarthritis.  States he has been medication compliant at home. Very irritable and angry upon approach and declines to meet in for admission but denies any issues.  Was just here yesterday.  Uncooperative and angry upon approach. VS elevated but had refused BP med's. Pt checked for contraband and skin assessment complete with Baxter HireKristen, Tech and no contraband found.  Pt went right to bed after this.

## 2016-07-22 NOTE — ED Notes (Signed)

## 2016-07-22 NOTE — ED Notes (Signed)
Pt refusing meds at time to include metoprolol. Pt informed BP 149/106. Pt continued to refuse.

## 2016-07-22 NOTE — ED Notes (Signed)
SOC in progress. Report to kim, rn.

## 2016-07-22 NOTE — ED Notes (Signed)
Pt is smiling and being cooperative. Pt watching TV at this time.

## 2016-07-22 NOTE — ED Notes (Signed)
Pt has a black backpack and shoes, shirt, pants in labeled white bag.

## 2016-07-22 NOTE — ED Notes (Signed)
Pt given meal tray at this time 

## 2016-07-22 NOTE — ED Notes (Signed)
BEHAVIORAL HEALTH ROUNDING Patient sleeping: Yes.   Patient alert and oriented: not applicable SLEEPING Behavior appropriate: Yes.  ; If no, describe: SLEEPING Nutrition and fluids offered: No SLEEPING Toileting and hygiene offered: NoSLEEPING Sitter present: not applicable, Q 15 min safety rounds and observation. Law enforcement present: Yes ODS 

## 2016-07-22 NOTE — ED Notes (Signed)
Telepsych MD called to inquire why the pt was receiving a 2nd consult. MD informed that the pt is no longer SI/HI. SOC currently being used in another room and Telepsych MD informed. RN informed him that he will be notified when the computer and pt are ready.

## 2016-07-22 NOTE — ED Notes (Addendum)
Pt was dressed out by noel, rn. Pt's belongings back to [facility]. Back pack and pt's white belonging bag labeled and placed on fake wood dresser in locker room.(unable to fit belonging in locker)

## 2016-07-22 NOTE — ED Notes (Signed)
Pt's SOC completed

## 2016-07-22 NOTE — ED Provider Notes (Signed)
[facility] Emergency Department Provider Note   ____________________________________________   First MD Initiated Contact with Patient 07/22/16 [phone]     (approximate)  I have reviewed the triage vital signs and the nursing notes.   HISTORY  Chief Complaint Suicidal    HPI Victor Scott is a 46 y.o. male who was brought in by police for suicidal ideation. The patient reports that his wife called the police and stated that he was threatening suicide. He reports that he was not acting his wife he was taking someone else. The patient was admitted to psych recently for an overdose and was discharged. He denies threatening to kill himself but the police saw texts were he was taking pictures of ropes and saying that he was coming to himself. He is undergoing a divorce with his wife at this time. The patient denies drinking or doing any drugs. He reports that he has been taking his medications. He is here today for evaluation.   Past Medical History:  Diagnosis Date  . HIV (human immunodeficiency virus infection) (HCC)   . Hypertension   . Seizures [facility])     Patient Active Problem List   Diagnosis Date Noted  . HTN (hypertension) 07/21/2016  . Cannabis use disorder, moderate, dependence (HCC) 07/21/2016  . Tobacco use disorder 07/21/2016  . Adjustment disorder with mixed anxiety and depressed mood 07/20/2016  . Suicide attempt by beta blocker overdose (HCC) 07/19/2016  . HIV (human immunodeficiency virus infection) (HCC) 07/19/2016  . Seizures (HCC) 07/19/2016  . Acute encephalopathy 10/19/2015    Past Surgical History:  Procedure Laterality Date  . none      Prior to Admission medications   Medication Sig Start Date End Date Taking? Authorizing Provider  aspirin EC 81 MG tablet Take 81 mg by mouth daily.   Yes [provider]  baclofen (LIORESAL) 10 MG tablet Take 10 mg by mouth 3 (three) times daily.   Yes [provider]    carbamazepine (TEGRETOL) 200 MG tablet Take 2 tablets (400 mg total) by mouth 2 (two) times daily before a meal. 07/21/16  Yes Pucilowska, Jolanta B, MD  darunavir-cobicistat (PREZCOBIX) 800-150 MG tablet Take 1 tablet by mouth daily with breakfast. Swallow whole. Do NOT crush, break or chew tablets. Take with food.   Yes [provider]  emtricitabine-tenofovir AF (DESCOVY) 200-25 MG tablet Take 1 tablet by mouth daily.   Yes [provider]  levETIRAcetam (KEPPRA) 750 MG tablet Take 2 tablets (1,500 mg total) by mouth 2 (two) times daily. 07/21/16  Yes Pucilowska, Jolanta B, MD  meloxicam (MOBIC) 15 MG tablet Take 1 tablet by mouth daily. 04/11/16 04/11/17 Yes [provider]  metoprolol succinate (TOPROL-XL) 50 MG 24 hr tablet Take 1 tablet (50 mg total) by mouth daily. 04/27/16  Yes Sharman Cheek, MD    Allergies Fish allergy  Family History  Problem Relation Age of Onset  . Hypertension Father     Social History Social History  Substance Use Topics  . Smoking status: Current Every Day Smoker    Packs/day: 1.00    Types: Cigarettes  . Smokeless tobacco: Never Used  . Alcohol use No    Review of Systems  Constitutional: No fever/chills Eyes: No visual changes. ENT: No sore throat. Cardiovascular: Denies chest pain. Respiratory: Denies shortness of breath. Gastrointestinal: No abdominal pain.  No nausea, no vomiting.  No diarrhea.  No constipation. Genitourinary: Negative for dysuria. Musculoskeletal: Negative for back pain. Skin: Negative  for rash. Neurological: Negative for headaches, focal weakness or numbness. Psych: Suicidal ideation  ____________________________________________   PHYSICAL EXAM:  VITAL SIGNS: ED Triage Vitals  Enc Vitals Group     BP 07/22/16 0338 (!) 172/111     Pulse Rate 07/22/16 0338 88     Resp 07/22/16 0338 20     Temp 07/22/16 0338 98.2 F (36.8 C)     Temp Source 07/22/16 0338 Oral     SpO2 07/22/16 0338  100 %     Weight 07/22/16 0339 (!) 330 lb (149.7 kg)     Height 07/22/16 0339 6\' 1"  (1.854 m)     Head Circumference --      Peak Flow --      Pain Score --      Pain Loc --      Pain Edu? --      Excl. in GC? --     Constitutional: Alert and oriented. Well appearing and in no distress. Eyes: Conjunctivae are normal. PERRL. EOMI. Head: Atraumatic. Nose: No congestion/rhinnorhea. Mouth/Throat: Mucous membranes are moist.  Oropharynx non-erythematous. Cardiovascular: Normal rate, regular rhythm. Grossly normal heart sounds.  Good peripheral circulation. Respiratory: Normal respiratory effort.  No retractions. Lungs CTAB. Gastrointestinal: Soft and nontender. No distention. Positive bowel sounds Musculoskeletal: No lower extremity tenderness nor edema.   Neurologic:  Normal speech and language.  Skin:  Skin is warm, dry and intact.  Psychiatric: Mood and affect are normal.   ____________________________________________   LABS (all labs ordered are listed, but only abnormal results are displayed)  Labs Reviewed  COMPREHENSIVE METABOLIC PANEL - Abnormal; Notable for the following:       Result Value   Potassium 3.4 (*)    Glucose, Bld 109 (*)    Total Protein 8.4 (*)    All other components within normal limits  ACETAMINOPHEN LEVEL - Abnormal; Notable for the following:    Acetaminophen (Tylenol), Serum <10 (*)    All other components within normal limits  CBC - Abnormal; Notable for the following:    RBC 6.30 (*)    MCV 74.7 (*)    MCH 24.3 (*)    RDW 15.8 (*)    All other components within normal limits  URINE DRUG SCREEN, QUALITATIVE ([facility] ONLY) - Abnormal; Notable for the following:    Cannabinoid 50 Ng, Ur Rosita POSITIVE (*)    All other components within normal limits  ETHANOL  SALICYLATE LEVEL   ____________________________________________  EKG  none ____________________________________________  RADIOLOGY  No results  found.  ____________________________________________   PROCEDURES  Procedure(s) performed: None  Procedures  Critical Care performed: No  ____________________________________________   INITIAL IMPRESSION / ASSESSMENT AND PLAN / ED COURSE  Pertinent labs & imaging results that were available during my care of the patient were reviewed by me and considered in my medical decision making (see chart for details).  This is a 46 year old male who comes into the hospital today with some suicidal ideation. The patient reports that he is not suicidal and that his ex-wife is making all of this up. The police did bring the patient into the hospital under involuntary commitment because they report that they did see the text messages. The patient was seen by TTS. We'll have the patient evaluated by the specialist on-call.      ____________________________________________   FINAL CLINICAL IMPRESSION(S) / ED DIAGNOSES  Final diagnoses:  Suicidal ideation      NEW MEDICATIONS STARTED DURING THIS VISIT:  New Prescriptions  No medications on file     Note:  This document was prepared using Dragon voice recognition software and may include unintentional dictation errors.    Rebecka Apley, MD 07/22/16 [phone]

## 2016-07-22 NOTE — BH Assessment (Signed)
Assessment Note  Victor Scott is an 46 y.o. male presenting to the ED under IVC by [employer].  [place]. Pt says he was texting a woman regarding "some pootang" when the police came and picked him up. Pt reports his wife told police he was texting her stating he was going to hang himself and had pictures of a rope. . Pt denies these allegations. He denies SI/HI and any auditory/visual hallucinations.    Pt was recently discharged [facility] BMU 07/21/16 after being on the unit for 1 day.    Diagnosis: Alcohol/Drug Use  Past Medical History:  Past Medical History:  Diagnosis Date  . HIV (human immunodeficiency virus infection) (HCC)   . Hypertension   . Seizures (HCC)     Past Surgical History:  Procedure Laterality Date  . none      Family History:  Family History  Problem Relation Age of Onset  . Hypertension Father     Social History:  reports that he has been smoking Cigarettes.  He has been smoking about 1.00 pack per day. He has never used smokeless tobacco. He reports that he uses drugs, including Marijuana. He reports that he does not drink alcohol.  Additional Social History:  Alcohol / Drug Use Pain Medications: See PTA Prescriptions: See PTA Over the Counter: See PTA History of alcohol / drug use?: Yes Substance #1 Name of Substance 1: Cannabis  CIWA: CIWA-Ar BP: (!) 148/103 Pulse Rate: 67 COWS:    Allergies:  Allergies  Allergen Reactions  . Fish Allergy     Home Medications:  (Not in a hospital admission)  OB/GYN Status:  No LMP for male patient.  General Assessment Data Location of Assessment: [facility] ED TTS Assessment: In system Is this a Tele or Face-to-Face Assessment?: Face-to-Face Is this an Initial Assessment or a Re-assessment for this encounter?: Initial Assessment Marital status: Married [place] name: n/a Is patient pregnant?: No Pregnancy Status: No Living  Arrangements: Other (Comment) (homeless) Can pt return to current living arrangement?: Yes Admission Status: Involuntary Is patient capable of signing voluntary admission?: No Referral Source: Self/Family/Friend Insurance type: None     Crisis Care Plan Living Arrangements: Other (Comment) (homeless) Legal Guardian: Other: (self) Name of Psychiatrist: Reports of none Name of Therapist: Reports of none  Education Status Is patient currently in school?: No Current Grade: n/a Highest grade of school patient has completed: High School Name of school: n/a Contact person: n/a  Risk to self with the past 6 months Suicidal Ideation: No Has patient been a risk to self within the past 6 months prior to admission? : Yes Suicidal Intent: No Has patient had any suicidal intent within the past 6 months prior to admission? : Yes Is patient at risk for suicide?: No Suicidal Plan?: No Has patient had any suicidal plan within the past 6 months prior to admission? : Yes Access to Means: Yes Specify Access to Suicidal Means: Prescription medications What has been your use of drugs/alcohol within the last 12 months?: Cannabis Previous Attempts/Gestures: Yes How many times?: 1 Other Self Harm Risks: Reports of none Triggers for Past Attempts: Family contact, Other (Comment) Intentional Self Injurious Behavior: None Family Suicide History: No Recent stressful life event(s): Divorce, Job Loss, Financial Problems Persecutory voices/beliefs?: No Depression: Yes Depression Symptoms: Feeling worthless/self pity, Loss of interest in usual pleasures, Isolating Substance abuse history and/or treatment for substance abuse?: No Suicide prevention information given to non-admitted  patients: Not applicable  Risk to Others within the past 6 months Homicidal Ideation: No Does patient have any lifetime risk of violence toward others beyond the six months prior to admission? : No Thoughts of Harm to  Others: No Current Homicidal Intent: No Current Homicidal Plan: No Access to Homicidal Means: No Identified Victim: None identified History of harm to others?: No Assessment of Violence: None Noted Violent Behavior Description: None reported Does patient have access to weapons?: No Criminal Charges Pending?: No Does patient have a court date: No Is patient on probation?: No  Psychosis Hallucinations: None noted Delusions: None noted  Mental Status Report Appearance/Hygiene: Disheveled Eye Contact: Good Motor Activity: Freedom of movement Speech: Logical/coherent Level of Consciousness: Alert Mood: Pleasant Affect: Appropriate to circumstance Anxiety Level: Minimal Thought Processes: Relevant, Coherent Judgement: Partial Orientation: Person, Place, Time, Situation, Appropriate for developmental age Obsessive Compulsive Thoughts/Behaviors: None  Cognitive Functioning Concentration: Normal Memory: Recent Intact, Remote Intact IQ: Average Insight: Good Impulse Control: Good Appetite: Good Weight Loss: 0 Weight Gain: 0 Sleep: No Change Total Hours of Sleep: 8 Vegetative Symptoms: None  ADLScreening [facility] Assessment Services) Patient's cognitive ability adequate to safely complete daily activities?: Yes Patient able to express need for assistance with ADLs?: Yes Independently performs ADLs?: Yes (appropriate for developmental age)  Prior Inpatient Therapy Prior Inpatient Therapy: Yes Prior Therapy Dates: 06/2010 [facility] Reason for Treatment: Depression  Prior Outpatient Therapy Prior Outpatient Therapy: No Prior Therapy Dates: Reports of none Prior Therapy Facilty/Provider(s): Reports of none Reason for Treatment: Reports of none Does patient have an ACCT team?: No Does patient have Intensive In-House Services?  : No Does patient have [facility] services? : No Does patient have P4CC services?: No  ADL Screening (condition at  time of admission) Patient's cognitive ability adequate to safely complete daily activities?: Yes Patient able to express need for assistance with ADLs?: Yes Independently performs ADLs?: Yes (appropriate for developmental age)       Abuse/Neglect Assessment (Assessment to be complete while patient is alone) Physical Abuse: Denies Verbal Abuse: Denies Sexual Abuse: Denies Exploitation of patient/patient's resources: Denies Self-Neglect: Denies Values / Beliefs Cultural Requests During Hospitalization: None Spiritual Requests During Hospitalization: None Consults Spiritual Care Consult Needed: No Social Work Consult Needed: No Merchant navy officerAdvance Directives (For Healthcare) Does Patient Have a Medical Advance Directive?: No    Additional Information 1:1 In Past 12 Months?: No CIRT Risk: No Elopement Risk: No Does patient have medical clearance?: Yes     Disposition:  Disposition Initial Assessment Completed for this Encounter: Yes Disposition of Patient: Other dispositions Other disposition(s): Other (Comment) [facility]  On Site Evaluation by:   Reviewed with Physician:    Artist Beachoxana C Xoie Kreuser 07/22/2016 6:56 AM

## 2016-07-22 NOTE — ED Triage Notes (Signed)
Pt presents to ED via [employer]. Pt's wife took IVC papers out on pt reporting that he sent her a text with a rope at his feet and the message that said he was suicidal. [employer] officer saw text where he is threatening to kill himself.

## 2016-07-23 DIAGNOSIS — F4323 Adjustment disorder with mixed anxiety and depressed mood: Secondary | ICD-10-CM

## 2016-07-23 LAB — LEVETIRACETAM LEVEL: Levetiracetam Lvl: NOT DETECTED ug/mL (ref 10.0–40.0)

## 2016-07-23 NOTE — [facility] Counselor (Signed)
Adult Comprehensive Assessment  Patient ID: Victor Scott, male   DOB: September 08, 1970, 46 y.o.   MRN: [id number]  Information Source: Information source: Patient  Current Stressors:  Educational / Learning stressors: n/a Employment / Job issues: Pt is currently unemployed Family Relationships: n/a Surveyor, quantityinancial / Lack of resources (include bankruptcy): Pt has no source of income.  Housing / Lack of housing: Pt states he will be living with his mother at discharge Physical health (include injuries & life threatening diseases): n/a Social relationships: Pt separated from his wife about 2 months ago Substance abuse: Patient denies Bereavement / Loss: n/a  Living/Environment/Situation:  Living Arrangements: Other relatives Living conditions (as described by patient or guardian): Pt reports it is fine.  How long has patient lived in current situation?: Pt has been living off and on with family members ever since him and his wife separated.  What is atmosphere in current home: Temporary  Family History:  Marital status: Married Number of Years Married: 1 What types of issues is patient dealing with in the relationship?: Patient recently told his wife he has HIV.  Additional relationship information: n/a Are you sexually active?: Yes What is your sexual orientation?: heterosexual Has your sexual activity been affected by drugs, alcohol, medication, or emotional stress?: n/a Does patient have children?: Yes How many children?: 1 How is patient's relationship with their children?: 1 son. Patient reports having a good relationship with his son.   Childhood History:  By whom was/is the patient raised?: Both parents Additional childhood history information: n/a Description of patient's relationship with caregiver when they were a child: Pt states he has a great childhood with no complaints.  Patient's description of current relationship with people who raised him/her: Father deceased. Pt  reports having a good relationship with his wife.  How were you disciplined when you got in trouble as a child/adolescent?: n/a Does patient have siblings?: Yes Number of Siblings: 2 Description of patient's current relationship with siblings: 2 brothers, 1 deceased. Patient reports having 13 other siblings by his father. Pt reports being close to all of his siblings.  Did patient suffer any verbal/emotional/physical/sexual abuse as a child?: No Did patient suffer from severe childhood neglect?: No Has patient ever been sexually abused/assaulted/raped as an adolescent or adult?: No Was the patient ever a victim of a crime or a disaster?: No Witnessed domestic violence?: No Has patient been effected by domestic violence as an adult?: No  Education:  Highest grade of school patient has completed: 11ith grade Currently a student?: No Name of school: n/a Learning disability?: No  Employment/Work Situation:   Employment situation: Unemployed Patient's job has been impacted by current illness: No What is the longest time patient has a held a job?: 5 years 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 Has patient ever been in the Eli Lilly and Companymilitary?: No Has patient ever served in combat?: No Did You Receive Any Psychiatric Treatment/Services While in Equities traderthe Military?: No Are There Guns or Education officer, communityther Weapons in Your Home?: No Are These ComptrollerWeapons Safely Secured?:  (Patient reports owning no guns or weapons)  Architectinancial Resources:   Surveyor, quantityinancial resources: No income, Sales executiveood stamps, Support from parents / caregiver Does patient have a Lawyerrepresentative payee or guardian?: No  Alcohol/Substance Abuse:   What has been your use of drugs/alcohol within the last 12 months?: Cannabis If attempted suicide, did drugs/alcohol play a role in this?: No Alcohol/Substance Abuse Treatment Hx: Denies past history Has alcohol/substance abuse ever caused legal problems?: No  Social Support System:   Patient's  Community Support System: Fair Describe Community Support System: Pt states he has support from his family. Type of faith/religion: n/a How does patient's faith help to cope with current illness?: n/a  Leisure/Recreation:   Leisure and Hobbies: Pt states he enjoys spending time with his grandchildren  Strengths/Needs:   What things does the patient do well?: good father, nice to others, resilient In what areas does patient struggle / problems for patient: relationship stress, passive suicidal thoughts  Discharge Plan:   Does patient have access to transportation?: Yes ([employer] bus) Will patient be returning to same living situation after discharge?: No Plan for living situation after discharge: Patient states he will be living with his mother at discharge.  Currently receiving community mental health services: No If no, would patient like referral for services when discharged?: Yes (What [place]. Pt states he would like to be connected to [facility]. ) Does patient have financial barriers related to discharge medications?: No  Summary/Recommendations:   Patient is a 46 year old male admitted involuntarily with a diagnosis of severe recurrent major depression. Information was obtained from psychosocial assessment completed with patient and chart review conducted by this evaluator. Patient presented to the hospital IVC by [facility] department. Patient texted his wife stating he was going to hang himself and had pictures of a rope. Patient recently separated from wife about 2 months ago. Patient will benefit from crisis stabilization, medication evaluation, group therapy and psycho education in addition to case management for discharge. At discharge, it is recommended that patient remain compliant with established discharge plan and continued treatment.   Dvon Jiles G. Garnette Czech MSW, [facility] 07/23/2016 3:40 PM

## 2016-07-23 NOTE — Plan of Care (Signed)
Problem: Self-Concept: Goal: Ability to disclose and discuss suicidal ideas will improve Pt will be able to report suicidal thoughts Outcome: Progressing Pt able to discuss his thoughts and feelings as needed

## 2016-07-23 NOTE — [facility] Group Notes (Signed)
[facility] LCSW Group Therapy  07/23/2016 2:02 PM  Type of Therapy:  Group Therapy  Participation Level:  Patient did not attend group. CSW invited patient to group.   Summary of Progress/Problems: Stress management: Patients defined and discussed the topic of stress and the related symptoms and triggers for stress. Patients identified healthy coping skills they would like to try during hospitalization and after discharge to manage stress in a healthy way. CSW offered insight to varying stress management techniques.   Myeisha Kruser G. Garnette CzechSampson MSW, LCSWA 07/23/2016, 2:02 PM

## 2016-07-23 NOTE — Plan of Care (Signed)
Problem: Self-Concept: Goal: Ability to verbalize positive feelings about self will improve Pt will be able to list at least 2 positive things about himself Outcome: Progressing Pt stated "I am a good person, don't be afraid" Pt is friendly and supportive to peers

## 2016-07-23 NOTE — Progress Notes (Signed)
Patient was withdrawn to room all shift and irritable. Patient refused all medication and did not come out for breakfast. Later on after lunch patient came out of room and was more interactive and took evening medication. Patient is alert and oriented x 4, breathing unlabored, and extremities x 4 within normal limits. Patient did not display any disruptive behavior.  Patient continues to be monitored on 15 minute safety checks. Will continue to monitor patient and notify MD of any changes.

## 2016-07-23 NOTE — Plan of Care (Signed)
Problem: Coping: Goal: Ability to cope will improve Outcome: Progressing Emotional support provided and pt was encouraged to talk to staff as needed

## 2016-07-23 NOTE — [facility] Suicide Risk Assessment (Signed)
[facility] INPATIENT:  Family/Significant Other Suicide Prevention Education  Suicide Prevention Education:  Patient Refusal for Family/Significant Other Suicide Prevention Education: The patient Victor Scott has refused to provide written consent for family/significant other to be provided Family/Significant Other Suicide Prevention Education during admission and/or prior to discharge.  Physician notified.  Naela Nodal G. Garnette CzechSampson MSW, LCSWA 07/23/2016, 3:50 PM

## 2016-07-23 NOTE — Plan of Care (Signed)
Problem: Education: Goal: Emotional status will improve Patient will participate in treatment plan by day 2 of admission Outcome: Progressing Patient attending groups and taking medications

## 2016-07-23 NOTE — Plan of Care (Signed)
Problem: Activity: Goal: Interest or engagement in activities will improve Pt will engage in unit activities within 2 days Outcome: Progressing Patient visible in the milieu, interacting with staff and peers. Involved in different activities

## 2016-07-23 NOTE — Plan of Care (Signed)
Problem: Safety: Goal: Periods of time without injury will increase Outcome: Progressing Patient remains safe and without injury during hospitalization and on Q 15 minute observation. Will continue to monitor patient.    

## 2016-07-23 NOTE — Plan of Care (Signed)
Problem: Education: Goal: Mental status will improve Patient will be free from suicidal ideations by day 2 of admission Outcome: Progressing Currently denying thoughts of self harm. Pleasant and cooperative

## 2016-07-23 NOTE — Progress Notes (Signed)
Patient has been in the milieu, mostly in the day room. Alert and oriented, pleasant and cooperative. Denying suicidal and homicidal thoughts. Denying hallucinations. Behavior observed when pt interacting with peers: patient is cooperative and friendly. Has no concern so far. Currently attending group and participating. Pt was encouraged to talk to staff as needed. Safety precautions maintained.

## 2016-07-23 NOTE — H&P (Signed)
Psychiatric Admission Assessment Adult  Patient Identification: Victor Scott MRN:  [id number] Date of Evaluation:  07/23/2016 Chief Complaint:  depression Principal Diagnosis: <principal problem not specified> Diagnosis:   Patient Active Problem List   Diagnosis Date Noted  . Severe recurrent major depression (Victor Scott) [F33.2] 07/22/2016  . HTN (hypertension) [I10] 07/21/2016  . Cannabis use disorder, moderate, dependence (Victor Scott) [F12.20] 07/21/2016  . Tobacco use disorder [F17.200] 07/21/2016  . Adjustment disorder with mixed anxiety and depressed mood [F43.23] 07/20/2016  . Suicide attempt by beta blocker overdose (Victor Scott) [T44.7X2A] 07/19/2016  . HIV (human immunodeficiency virus infection) (Victor Scott) [B20] 07/19/2016  . Seizures (Victor Scott) [R56.9] 07/19/2016  . Acute encephalopathy [G93.40] 10/19/2015   History of Present Illness:   Identifying data. Victor Scott is a 46 year old male with no past psychiatric history.  Chief complaint. "My wife and I have some issues, I did the things I didn't mean to."  HPI- Pt interviewed, chart reviewed. Pt with adjustment disorder was discharged from this unit 2 days ago on 13th after 1 day stay. Pt  presenting to the ED again under IVC by [facility].  [place]  [place]ing his ex girlfriend and , his wife  when the police came and picked him up. Per report,  his wife told police he was texting her stating he was going to hang himself and had   Pt initially denied these allegations to other staff, now states he "didn't mean to hurt himself". States he should not be in the hospital, anxious and upset that he cannot leave and missing visit to his grandchildren.  He denies SI/HI and any auditory/visual hallucinations. Denies depression.  Per report, pt had some alcohol during texting, denies drinking any alcohol to me, alcohol level <5.  UDS- positive for THC. Pt hoping to improve relationship with his wife, states he is staying  with his mom for last 2 months.   Per record,  For the past several months the patient has been experiencing marital problems and has been separated from his wife. On the day of admission he overdosed on metoprolol that is prescribed to him for hypertension but immediately called a friend and was brought to the hospital. He denies suicidal intention and explained that he was trying to get his wife's attention. Indeed she visited him in the emergency room. The patient realizes that he has been less than ideal husband, not wanting to talk to his wife about these problems, and withholding Scott-critical health information from her. During the past 2 months, he did a lot of soul searching, started reading the Bible, but connected with his church friends and decided that he would do what is necessary to reveal his marriage. He denies symptoms of depression, anxiety, psychosis or symptoms suggestive of bipolar disorder. This overdose was impulsive and did not have prior suicidal thoughts he smokes marijuana regularly but denies alcohol, illicit substance, or prescription pill abuse.  Past psychiatric history. Just d/ced from [facility] on 7[facility] after 1 night stay for adjustment issues with his wife. No other psych  hospitalization, No h/o being treated for mental illness, or attempted suicide. He has past history of cocaine use but has been clean of heart substances for years. He has been in prison several times for a total of 21 years beginning at the age of 8. He has never been in mental health while incarcerated.  Family psychiatric history. His father and 2 brothers died of alcoholism.  Social history. He  has been married for over a year ago to a woman is known for several years. He was not forthcoming about his HIV status with her and it created a rift as she has learned about it from Dr. Ola Spurr when the patient was hospitalized for seizures in the fall. He has been seizure-free for 6 months and is not allowed  to return to work. 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 It is difficult for him to get a job given his criminal past. He has a son who is 48 years old and has 6 grandchildren. There are no current legal charges pending.  Total Time spent with patient: 1 hour  Is the patient at risk to Scott? No.  Has the patient been a risk to Scott in the past 6 months? No.  Has the patient been a risk to Scott within the distant past? No.  Is the patient a risk to others? No.  Has the patient been a risk to others in the past 6 months? No.  Has the patient been a risk to others within the distant past? No.   Prior Inpatient Therapy:   Prior Outpatient Therapy:    Alcohol Screening: Patient refused Alcohol Screening Tool: Yes Substance Abuse History in the last 12 months:  Yes.   Consequences of Substance Abuse: Negative Previous Psychotropic Medications: No  Psychological Evaluations: No  Past Medical History:  Past Medical History:  Diagnosis Date  . Depression   . HIV (human immunodeficiency virus infection) ([place])   . Hypertension   . Seizures ([place])     Past Surgical History:  Procedure Laterality Date  . NO PAST SURGERIES    . none     Family History:  Family History  Problem Relation Age of Onset  . Hypertension Father    Tobacco Screening: Have you used any form of tobacco in the last 30 days? (Cigarettes, Smokeless Tobacco, Cigars, and/or Pipes): Yes Tobacco use, Select all that apply: 5 or more cigarettes per day Are you interested in Tobacco Cessation Medications?: No, patient refused Counseled patient on smoking cessation including recognizing danger situations, developing coping skills and basic information about quitting provided: Yes Social History:  History  Alcohol Use No     History  Drug Use  . Types: Marijuana    Comment: denies [phone]    Additional Social History:      Pain Medications: marijuanna                    Allergies:    Allergies  Allergen Reactions  . Fish Allergy    Lab Results:  Results for orders placed or performed during the hospital encounter of 07/22/16 (from the past 48 hour(s))  Comprehensive metabolic panel     Status: Abnormal   Collection Time: 07/22/16  3:46 AM  Result Value Ref Range   Sodium 143 135 - 145 mmol/L   Potassium 3.4 (L) 3.5 - 5.1 mmol/L   Chloride 109 101 - 111 mmol/L   CO2 23 22 - 32 mmol/L   Glucose, Bld 109 (H) 65 - 99 mg/dL   BUN 8 6 - 20 mg/dL   Creatinine, Ser 1.01 0.61 - 1.24 mg/dL   Calcium 9.1 8.9 - 10.3 mg/dL   Total Protein 8.4 (H) 6.5 - 8.1 g/dL   Albumin 4.6 3.5 - 5.0 g/dL   AST 30 15 - 41 U/L   ALT 47 17 - 63 U/L   Alkaline Phosphatase 60 38 -  126 U/L   Total Bilirubin 0.8 0.3 - 1.2 mg/dL   GFR calc non Af Amer >60 >60 mL/min   GFR calc Af Amer >60 >60 mL/min    Comment: (NOTE) The eGFR has been calculated using the CKD EPI equation. This calculation has not been validated in all clinical situations. eGFR's persistently <60 mL/min signify possible Chronic Kidney Disease.    Anion gap 11 5 - 15  Ethanol     Status: None   Collection Time: 07/22/16  3:46 AM  Result Value Ref Range   Alcohol, Ethyl (B) <5 <5 mg/dL    Comment:        LOWEST DETECTABLE LIMIT FOR SERUM ALCOHOL IS 5 mg/dL FOR MEDICAL PURPOSES ONLY   Salicylate level     Status: None   Collection Time: 07/22/16  3:46 AM  Result Value Ref Range   Salicylate Lvl <1.6 2.8 - 30.0 mg/dL  Acetaminophen level     Status: Abnormal   Collection Time: 07/22/16  3:46 AM  Result Value Ref Range   Acetaminophen (Tylenol), Serum <10 (L) 10 - 30 ug/mL    Comment:        THERAPEUTIC CONCENTRATIONS VARY SIGNIFICANTLY. A RANGE OF 10-30 ug/mL MAY BE AN EFFECTIVE CONCENTRATION FOR MANY PATIENTS. HOWEVER, SOME ARE BEST TREATED AT CONCENTRATIONS OUTSIDE THIS RANGE. ACETAMINOPHEN CONCENTRATIONS >150 ug/mL AT 4 HOURS AFTER INGESTION AND >50 ug/mL AT 12 HOURS AFTER INGESTION ARE OFTEN  ASSOCIATED WITH TOXIC REACTIONS.   cbc     Status: Abnormal   Collection Time: 07/22/16  3:46 AM  Result Value Ref Range   WBC 6.0 3.8 - 10.6 K/uL   RBC 6.30 (H) 4.40 - 5.90 MIL/uL   Hemoglobin 15.3 13.0 - 18.0 g/dL   HCT 47.1 40.0 - 52.0 %   MCV 74.7 (L) 80.0 - 100.0 fL   MCH 24.3 (L) 26.0 - 34.0 pg   MCHC 32.5 32.0 - 36.0 g/dL   RDW 15.8 (H) 11.5 - 14.5 %   Platelets 207 150 - 440 K/uL  Urine Drug Screen, Qualitative     Status: Abnormal   Collection Time: 07/22/16  3:46 AM  Result Value Ref Range   Tricyclic, Ur Screen NONE DETECTED NONE DETECTED   Amphetamines, Ur Screen NONE DETECTED NONE DETECTED   MDMA (Ecstasy)Ur Screen NONE DETECTED NONE DETECTED   Cocaine Metabolite,Ur [place] NONE DETECTED NONE DETECTED   Opiate, Ur Screen NONE DETECTED NONE DETECTED   Phencyclidine (PCP) Ur S NONE DETECTED NONE DETECTED   Cannabinoid 50 Ng, Ur [place] POSITIVE (A) NONE DETECTED   Barbiturates, Ur Screen NONE DETECTED NONE DETECTED   Benzodiazepine, Ur Scrn NONE DETECTED NONE DETECTED   Methadone Scn, Ur NONE DETECTED NONE DETECTED    Comment: (NOTE) 553  Tricyclics, urine               Cutoff 1000 ng/mL 200  Amphetamines, urine             Cutoff 1000 ng/mL 300  MDMA (Ecstasy), urine           Cutoff 500 ng/mL 400  Cocaine Metabolite, urine       Cutoff 300 ng/mL 500  Opiate, urine                   Cutoff 300 ng/mL 600  Phencyclidine (PCP), urine      Cutoff 25 ng/mL 700  Cannabinoid, urine  Cutoff 50 ng/mL 800  Barbiturates, urine             Cutoff 200 ng/mL 900  Benzodiazepine, urine           Cutoff 200 ng/mL 1000 Methadone, urine                Cutoff 300 ng/mL 1100 1200 The urine drug screen provides only a preliminary, unconfirmed 1300 analytical test result and should not be used for non-medical 1400 purposes. Clinical consideration and professional judgment should 1500 be applied to any positive drug screen result due to possible 1600 interfering substances. A more  specific alternate chemical method 1700 must be used in order to obtain a confirmed analytical result.  1800 Gas chromato graphy / mass spectrometry (GC/MS) is the preferred 1900 confirmatory method.     Blood Alcohol level:  Lab Results  Component Value Date   ETH <5 07/22/2016   ETH <5 16/01/930    Metabolic Disorder Labs:  Lab Results  Component Value Date   HGBA1C 5.3 07/21/2016   MPG 105 07/21/2016   No results found for: PROLACTIN Lab Results  Component Value Date   CHOL 161 07/21/2016   TRIG 213 (H) 07/21/2016   HDL 21 (L) 07/21/2016   CHOLHDL 7.7 07/21/2016   VLDL 43 (H) 07/21/2016   LDLCALC 97 07/21/2016    Current Medications: Current Facility-Administered Medications  Medication Dose Route Frequency Provider Last Rate Last Dose  . acetaminophen (TYLENOL) tablet 650 mg  650 mg Oral Q6H PRN Lenward Chancellor, MD      . alum & mag hydroxide-simeth (MAALOX/MYLANTA) 200-200-20 MG/5ML suspension 30 mL  30 mL Oral Q4H PRN Lenward Chancellor, MD      . aspirin EC tablet 81 mg  81 mg Oral Daily Lenward Chancellor, MD      . baclofen (LIORESAL) tablet 10 mg  10 mg Oral TID Lenward Chancellor, MD      . carbamazepine (TEGRETOL) tablet 400 mg  400 mg Oral BID AC Lenward Chancellor, MD      . darunavir-cobicistat (PREZCOBIX) 800-150 MG per tablet 1 tablet  1 tablet Oral Q breakfast Lenward Chancellor, MD      . emtricitabine-tenofovir AF (DESCOVY) 200-25 MG per tablet 1 tablet  1 tablet Oral Daily Lenward Chancellor, MD      . hydrOXYzine (ATARAX/VISTARIL) tablet 25 mg  25 mg Oral TID PRN Lenward Chancellor, MD      . levETIRAcetam (KEPPRA) tablet 1,500 mg  1,500 mg Oral BID Lenward Chancellor, MD      . magnesium hydroxide (MILK OF MAGNESIA) suspension 30 mL  30 mL Oral Daily PRN Lenward Chancellor, MD      . metoprolol succinate (TOPROL-XL) 24 hr tablet 50 mg  50 mg Oral Daily Lenward Chancellor, MD      . traZODone (DESYREL) tablet 50 mg  50 mg Oral QHS PRN Lenward Chancellor, MD        PTA Medications: Prescriptions Prior to Admission  Medication Sig Dispense Refill Last Dose  . aspirin EC 81 MG tablet Take 81 mg by mouth daily.   07/21/2016 at Unknown time  . baclofen (LIORESAL) 10 MG tablet Take 10 mg by mouth 3 (three) times daily.   07/21/2016 at Unknown time  . carbamazepine (TEGRETOL) 200 MG tablet Take 2 tablets (400 mg total) by mouth 2 (two) times daily before a meal. 120 tablet 1 07/21/2016 at Unknown time  . darunavir-cobicistat (PREZCOBIX) 800-150 MG tablet Take 1 tablet by  mouth daily with breakfast. Swallow whole. Do NOT crush, break or chew tablets. Take with food.   07/21/2016 at Unknown time  . emtricitabine-tenofovir AF (DESCOVY) 200-25 MG tablet Take 1 tablet by mouth daily.   07/21/2016 at Unknown time  . meloxicam (MOBIC) 15 MG tablet Take 1 tablet by mouth daily.   07/21/2016 at Unknown time  . metoprolol succinate (TOPROL-XL) 50 MG 24 hr tablet Take 1 tablet (50 mg total) by mouth daily. 15 tablet 0 07/21/2016 at Unknown time    Musculoskeletal: Strength & Muscle Tone: within normal limits Gait & Station: normal Patient leans: N/A  Psychiatric Specialty Exam: Physical Exam  Nursing note and vitals reviewed. Constitutional: He is oriented to person, place, and time. He appears well-developed and well-nourished.  HENT:  Head: Normocephalic and atraumatic.  Eyes: Pupils are equal, round, and reactive to light. Conjunctivae and EOM are normal.  Neck: Normal range of motion. Neck supple.  Cardiovascular: Normal rate, regular rhythm and normal heart sounds.   Respiratory: Effort normal and breath sounds normal.  GI: Soft. Bowel sounds are normal.  Musculoskeletal: Normal range of motion.  Neurological: He is alert and oriented to person, place, and time.  Skin: Skin is warm and dry.  Psychiatric: He has a normal mood and affect. His speech is normal and behavior is normal. Thought content normal. Cognition and memory are normal. He expresses  impulsivity.    Review of Systems  Neurological: Positive for seizures.  Psychiatric/Behavioral: Positive for substance abuse.  All other systems reviewed and are negative.   Blood pressure (!) 137/99, pulse 63, temperature 98 F (36.7 C), temperature source Oral, resp. rate 18, height _0  (1.905 m), weight (!) 149.7 kg (330 lb).Body mass index is 41.25 kg/m.  General Appearance: Casual  Eye Contact:  Good  Speech:  Clear and Coherent  Volume:  Normal  Mood:  upset  Affect:  Dysphoric, anxious  Thought Process:  Goal Directed and Descriptions of Associations: Intact  Orientation:  Full (Time, Place, and Person)  Thought Content:  Preoccupied with leaving hospital  Suicidal Thoughts:  No  Homicidal Thoughts:  No  Memory:  Immediate;   Fair Recent;   Fair Remote;   Fair  Judgement:  Impaired  Insight:  Present  Psychomotor Activity:  Normal  Concentration:  Concentration: Fair and Attention Span: Fair  Recall:  [employer] of Knowledge:  Fair  Language:  Fair  Akathisia:  No  Handed:  Right  AIMS (if indicated):     Assets:  Communication Skills Desire for Improvement Housing Resilience Social Support  ADL's:  Intact  Cognition:  WNL  Sleep:  Number of Hours: 6.15    Treatment Plan Summary: Daily contact with patient to assess and evaluate symptoms and progress in treatment and Medication management   Mr. Bowdish is a 46 year old male with adjustment issues readmitted after texting suicidal statement  with pictures of a rope. to his wife  in the context of marital discord.  1. Suicidal ideation. The patient  denies any suicidal  intention, or plans to hurt himself or others. He is able to contract for safety. He is  optimistic about the future. He is a loving father and grandfather 6 grandchildren.  2. Mood. The patient denies any symptoms of depression and is not interested in pharmacotherapy.   3. Cannabis abuse. The patient minimizes his problems but is  interested in substance abuse treatment.  4. Hypertension. He is on aspirin Toprol. We have Toprol  due to overdose. His blood pressure is again elevated.  5. Seizure. We continued Tegretol.  6. HIV. We continued antiretrovirals.  7. Disposition. He will be discharged with his mother. He will follow up with [facility] in [place] as he will spend few months with his uncle there.   Observation Level/Precautions:  15 minute checks  Laboratory:  CBC Chemistry Profile UDS UA  Psychotherapy:    Medications:    Consultations:    Discharge Concerns:    Estimated LOS:  Other:     Physician Treatment Plan for Primary Diagnosis: <principal problem not specified> Long Term Goal(s): Improvement in symptoms so as ready for discharge  Short Term Goals: Ability to identify changes in lifestyle to reduce recurrence of condition will improve, Ability to verbalize feelings will improve, Ability to disclose and discuss suicidal ideas, Ability to demonstrate Scott-control will improve, Ability to identify and develop effective coping behaviors will improve and Ability to identify triggers associated with substance abuse/mental health issues will improve  Physician Treatment Plan for Secondary Diagnosis: Active Problems:   Severe recurrent major depression ([place])  Long Term Goal(s): Improvement in symptoms so as ready for discharge  Short Term Goals: Ability to identify changes in lifestyle to reduce recurrence of condition will improve, Ability to demonstrate Scott-control will improve and Ability to identify triggers associated with substance abuse/mental health issues will improve  I certify that inpatient services furnished can reasonably be expected to improve the patient's condition.    Lenward Chancellor, MD 7/15/20182:26 PM

## 2016-07-23 NOTE — [facility] Suicide Risk Assessment (Signed)
[facility] Admission Suicide Risk Assessment   Nursing information obtained from:    Demographic factors:    Current Mental Status:    Loss Factors:    Historical Factors:    Risk Reduction Factors:     Total Time spent with patient: 1hr  Principal Problem: <principal problem not specified> Diagnosis:   Patient Active Problem List   Diagnosis Date Noted  . Severe recurrent major depression ([facility]) [F33.2] 07/22/2016  . HTN (hypertension) [I10] 07/21/2016  . Cannabis use disorder, moderate, dependence (H[facility]) [F12.20] 07/21/2016  . Tobacco use disorder [F17.200] 07/21/2016  . Adjustment disorder with mixed anxiety and depressed mood [F43.23] 07/20/2016  . Suicide attempt by beta blocker overdose (H[facility]) [T44.7X2A] 07/19/2016  . HIV (human immunodeficiency virus infection) (H[facility]) [B20] 07/19/2016  . Seizures (HCC) [R56.9] 07/19/2016  . Acute encephalopathy [G93.40] 10/19/2015   Subjective Data:  "My wife and I have some issues, I did the things I didn't mean to." Mr. Dauber is a 46 year old male with adjustment issues readmitted after texting suicidal statement  with pictures of a rope to his wife  in the context of marital discord.  Continued Clinical Symptoms: anxiety, dysphoria , guarded, minimizing symptoms   The "Alcohol Use Disorders Identification Test", Guidelines for Use in Primary Care, Second Edition.  World Science writer [facility]). Score between 0-7:  no or low risk or alcohol related problems. Score between 8-15:  moderate risk of alcohol related problems. Score between 16-19:  high risk of alcohol related problems. Score 20 or above:  warrants further diagnostic evaluation for alcohol dependence and treatment.   CLINICAL FACTORS:   Depression:   Comorbid alcohol abuse/dependence Impulsivity Alcohol/Substance Abuse/Dependencies Epilepsy Medical Diagnoses and Treatments/Surgeries   Musculoskeletal: Strength & Muscle Tone: within normal limits Gait & Station:  normal Patient leans: N/A  Psychiatric Specialty Exam: Physical Exam  Nursing note and vitals reviewed. Psychiatric: He has a normal mood and affect. His speech is normal and behavior is normal. Thought content normal. Cognition and memory are normal. He expresses impulsivity.    Review of Systems  Psychiatric/Behavioral: Positive for substance abuse.  All other systems reviewed and are negative.   Blood pressure (!) 137/99, pulse 63, temperature 98 F (36.7 C), temperature source Oral, resp. rate 18, height 6\' 3"  (1.905 m), weight (!) 149.7 kg (330 lb).Body mass index is 41.25 kg/m.  General Appearance: Casual  Eye Contact:  Good  Speech:  Clear and Coherent  Volume:  Normal  Mood:  upset  Affect:  Anxious, dysphoric  Thought Process:  Goal Directed and Descriptions of Associations: Intact  Orientation:  Full (Time, Place, and Person)  Thought Content:  WDL  Suicidal Thoughts:  No  Homicidal Thoughts:  No  Memory:  Immediate;   Fair Recent;   Fair Remote;   Fair  Judgement:  Impaired  Insight:  shallow  Psychomotor Activity:  Normal  Concentration:  Concentration: Fair and Attention Span: Fair  Recall:  [employer] of Knowledge:  Fair  Language:  Fair  Akathisia:  No  Handed:  Right  AIMS (if indicated):     Assets:  Communication Skills Desire for Improvement Housing Resilience Social Support  ADL's:  Intact  Cognition:  WNL  Sleep:  Number of Hours: 6.15      COGNITIVE FEATURES THAT CONTRIBUTE TO RISK:  None    SUICIDE RISK:   Moderate:   PLAN OF CARE: hospital admission, medication management, substance abuse counseling, discharge planning.  Mr. Rorke is a 46 year old male  with adjustment issues readmitted after texting suicidal statement  with pictures of a rope to his wife  in the context of marital discord.  1. Suicidal ideation. The patient adamantly denies any  intention, or plans to hurt himself or others. He is able to contract for safety. He is  forward thinking and optimistic about the future. He is a loving father and grandfather 6 grandchildren.  2. Mood. The patient denies any symptoms of depression and is not interested in pharmacotherapy. He is interested in faith counseling following discharge.  3. Cannabis abuse. The patient minimizes his problems but is interested in substance abuse treatment.  4. Hypertension. He is on aspirin Toprol. We have Toprol due to overdose. His blood pressure is again elevated.  5. Seizure. We continued Tegretol.  6. HIV. We continued antiretrovirals.  7. Disposition. He will be discharged with his mother. He [place] [facility] [place] was Few months with his uncle there.  I certify that inpatient services furnished can reasonably be expected to improve the patient's condition.   Beverly SessionsJagannath Giann Obara, MD 07/23/2016, 2:29 PM

## 2016-07-24 DIAGNOSIS — F332 Major depressive disorder, recurrent severe without psychotic features: Principal | ICD-10-CM

## 2016-07-24 MED ORDER — NICOTINE 21 MG/24HR TD PT24
21.0000 mg | MEDICATED_PATCH | Freq: Every day | TRANSDERMAL | Status: DC
  Administered 2016-07-25 – 2016-07-28 (×4): 21 mg via TRANSDERMAL
  Filled 2016-07-24 (×4): qty 1

## 2016-07-24 MED ORDER — SERTRALINE HCL 25 MG PO TABS
25.0000 mg | ORAL_TABLET | Freq: Every day | ORAL | Status: DC
  Administered 2016-07-24 – 2016-07-28 (×5): 25 mg via ORAL
  Filled 2016-07-24 (×5): qty 1

## 2016-07-24 NOTE — Progress Notes (Signed)
[facility]  07/24/2016 4:36 PM Ronalee BeltsBartholomew Shepherd  MRN:  [id number] Subjective:  Pt with adjustment disorder was discharged from this unit 2 days ago on 13th after 1 day stay. [place] [facility] [facility]. [place]  was texting his ex girlfriend and , his wife  when the police came and picked him up. Per report, his wife told police he was texting her stating he was going to hang himself and had   Pt initially denied these allegations to other staff, now states he "didn't mean to hurt himself". States he should not be in the hospital, anxious and upset that he cannot leave and missing visit to his grandchildren.  He denies SI/HI and any auditory/visual hallucinations. Denies depression. Per report, pt had some alcohol during texting, denies drinking any alcohol to me, alcohol level <5.  UDS- positive for THC. Pt hoping to improve relationship with his wife, states he is staying with his mom for last 2 months.   Per record,  For the past several months the patient has been experiencing marital problems and has been separated from his wife. On the day of admission he overdosed on metoprolol that is prescribed to him for hypertension but immediately called a friend and was brought to the hospital. He denies suicidal intention and explained that he was trying to get his wife's attention. Indeed she visited him in the emergency room. The patient realizes that he has been less than ideal husband, not wanting to talk to his wife about these problems, and withholding self-critical health information from her. During the past 2 months, he did a lot of soul searching, started reading the Bible, but connected with his church friends and decided that he would do what is necessary to reveal his marriage. He denies symptoms of depression, anxiety, psychosis or symptoms suggestive of bipolar disorder. This overdose was impulsive and did not have  prior suicidal thoughts he smokes marijuana regularly but denies alcohol, illicit substance, or prescription pill abuse.  7/16 the patient was seen in treatment team today. He said he is doing very well. He denies any depressive symptoms or problems with his sleep, appetite, energy or concentration. He denies any suicidality, homicidality or auditory or visual hallucinations. The patient feels that there is no reason for him to be in the hospital. She is requesting to be discharged today. He denies having any psychiatric needs at this point.  Patient was just discharged from the unit after an overdose. He returns after telling family he was going to commit suicide by hanging.   Patient minimizes these issues. Doesn't seem to see the gravity of the events and the fact that he is being hospitalized twice back to back.   Per nursing: Patient has been in the milieu, mostly in the day room. Alert and oriented, pleasant and cooperative. Denying suicidal and homicidal thoughts. Denying hallucinations. Behavior observed when pt interacting with peers: patient is cooperative and friendly. Has no concern so far. Currently attending group and participating. Pt was encouraged to talk to staff as needed. Safety precautions maintained.  Principal Problem: Severe recurrent major depression (HCC) Diagnosis:   Patient Active Problem List   Diagnosis Date Noted  . Severe recurrent major depression (HCC) [F33.2] 07/22/2016  . HTN (hypertension) [I10] 07/21/2016  . Cannabis use disorder, moderate, dependence (HCC) [F12.20] 07/21/2016  . Tobacco use disorder [F17.200] 07/21/2016  . HIV (human immunodeficiency virus infection) (HCC) [  B20] 07/19/2016  . Seizures (HCC) [R56.9] 07/19/2016   Total Time spent with patient: 30 minutes  .  Past Psychiatric History: Just d/ced [facility] on 7/13 after 1 night stay for adjustment issues with his wife. No other psych  hospitalization, No h/o being treated for mental  illness, or attempted suicide. He has past history of cocaine use but has been clean of heart substances for years. He has been in prison several times for a total of 21 years beginning at the age of 22. He has never been in mental health while incarcerated.  Past Medical History:  Past Medical History:  Diagnosis Date  . Depression   . HIV (human immunodeficiency virus infection) (HCC)   . Hypertension   . Seizures (HCC)     Past Surgical History:  Procedure Laterality Date  . NO PAST SURGERIES    . none     Family History:  Family History  Problem Relation Age of Onset  . Hypertension Father    Family Psychiatric  History: His father and 2 brothers died of alcoholism  Social History: He has been married for over a year ago to a woman is known for several years. He was not forthcoming about his HIV status with her and it created a rift as she has learned about it from Dr. Sampson Goon when the patient was hospitalized for seizures in the fall. He has been seizure-free for 6 months and is not allowed to return to work. 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 It is difficult for him to get a job given his criminal past. He has a son who is 93 years old and has 6 grandchildren. There are no current legal charges pending. History  Alcohol Use No     History  Drug Use  . Types: Marijuana    Comment: denies [id number]    Social History   Social History  . Marital status: Married    Spouse name: N/A  . Number of children: N/A  . Years of education: N/A   Social History Main Topics  . Smoking status: Current Every Day Smoker    Packs/day: 1.00    Types: Cigarettes  . Smokeless tobacco: Never Used  . Alcohol use No  . Drug use: Yes    Types: Marijuana     Comment: denies [id number]  . Sexual activity: Yes     Comment: has HIV   Other Topics Concern  . None   Social History Narrative  . None   Additional Social History:    Pain Medications: marijuanna       Current Medications: Current Facility-Administered Medications  Medication Dose Route Frequency Provider Last Rate Last Dose  . acetaminophen (TYLENOL) tablet 650 mg  650 mg Oral Q6H PRN Beverly Sessions, MD      . alum & mag hydroxide-simeth (MAALOX/MYLANTA) 200-200-20 MG/5ML suspension 30 mL  30 mL Oral Q4H PRN Beverly Sessions, MD      . aspirin EC tablet 81 mg  81 mg Oral Daily Beverly Sessions, MD   81 mg at 07/24/16 0916  . baclofen (LIORESAL) tablet 10 mg  10 mg Oral TID Beverly Sessions, MD   10 mg at 07/23/16 1648  . carbamazepine (TEGRETOL) tablet 400 mg  400 mg Oral BID AC Beverly Sessions, MD   400 mg at 07/24/16 0916  . darunavir-cobicistat (PREZCOBIX) 800-150 MG per tablet 1 tablet  1 tablet Oral Q breakfast Beverly Sessions, MD      .  emtricitabine-tenofovir AF (DESCOVY) 200-25 MG per tablet 1 tablet  1 tablet Oral Daily Beverly Sessions, MD   1 tablet at 07/24/16 4098  . hydrOXYzine (ATARAX/VISTARIL) tablet 25 mg  25 mg Oral TID PRN Beverly Sessions, MD      . levETIRAcetam (KEPPRA) tablet 1,500 mg  1,500 mg Oral BID Beverly Sessions, MD      . magnesium hydroxide (MILK OF MAGNESIA) suspension 30 mL  30 mL Oral Daily PRN Beverly Sessions, MD      . metoprolol succinate (TOPROL-XL) 24 hr tablet 50 mg  50 mg Oral Daily Beverly Sessions, MD   50 mg at 07/24/16 0917  . sertraline (ZOLOFT) tablet 25 mg  25 mg Oral Daily Jimmy Footman, MD      . traZODone (DESYREL) tablet 50 mg  50 mg Oral QHS PRN Beverly Sessions, MD        Lab Results: No results found for this or any previous visit (from the past 48 hour(s)).  Blood Alcohol level:  Lab Results  Component Value Date   ETH <5 07/22/2016   ETH <5 07/19/2016    Metabolic Disorder Labs: Lab Results  Component Value Date   HGBA1C 5.3 07/21/2016   MPG 105 07/21/2016   No results found for: PROLACTIN Lab Results  Component Value Date   CHOL 161 07/21/2016   TRIG 213 (H) 07/21/2016   HDL 21 (L)  07/21/2016   CHOLHDL 7.7 07/21/2016   VLDL 43 (H) 07/21/2016   LDLCALC 97 07/21/2016    Physical Findings: AIMS: Facial and Oral Movements Muscles of Facial Expression: None, normal Lips and Perioral Area: None, normal Jaw: None, normal Tongue: None, normal,Extremity Movements Upper (arms, wrists, hands, fingers): None, normal Lower (legs, knees, ankles, toes): None, normal, Trunk Movements Neck, shoulders, hips: None, normal, Overall Severity Severity of abnormal movements (highest score from questions above): None, normal Incapacitation due to abnormal movements: None, normal Patient's awareness of abnormal movements (rate only patient's report): No Awareness, Dental Status Current problems with teeth and/or dentures?: No Does patient usually wear dentures?: No  CIWA:  CIWA-Ar Total: 0 COWS:     Musculoskeletal: Strength & Muscle Tone: within normal limits Gait & Station: normal Patient leans: N/A  Psychiatric Specialty Exam: Physical Exam  Constitutional: He is oriented to person, place, and time. He appears well-developed and well-nourished.  HENT:  Head: Normocephalic and atraumatic.  Eyes: Conjunctivae and EOM are normal.  Respiratory: Effort normal.  Musculoskeletal: Normal range of motion.  Neurological: He is alert and oriented to person, place, and time.    Review of Systems  Psychiatric/Behavioral: Negative for depression, hallucinations, memory loss, substance abuse and suicidal ideas. The patient is not nervous/anxious and does not have insomnia.   All other systems reviewed and are negative.   Blood pressure (!) 145/92, pulse (!) 50, temperature 98 F (36.7 C), temperature source Oral, resp. rate 19, height 6\' 3"  (1.905 m), weight (!) 149.7 kg (330 lb), SpO2 100 %.Body mass index is 41.25 kg/m.  General Appearance: Well Groomed  Eye Contact:  Good  Speech:  Clear and Coherent  Volume:  Normal  Mood:  Euthymic  Affect:  Appropriate and Congruent   Thought Process:  Linear and Descriptions of Associations: Intact  Orientation:  Full (Time, Place, and Person)  Thought Content:  Hallucinations: None  Suicidal Thoughts:  No  Homicidal Thoughts:  No  Memory:  Immediate;   Fair Recent;   Fair Remote;   Fair  Judgement:  Poor  Insight:  Shallow  Psychomotor Activity:  Normal  Concentration:  Concentration: Fair and Attention Span: Fair  Recall:  Good  Fund of Knowledge:  Fair  Language:  Good  Akathisia:  No  Handed:    AIMS (if indicated):     Assets:  Financial Resources/Insurance Housing  ADL's:  Intact  Cognition:  WNL  Sleep:  Number of Hours: 6     Treatment Plan Summary: Daily contact with patient to assess and evaluate symptoms and progress in treatment and Medication management   Patient with HIV and seizures. Admitted for the second time to our unit this month. The first hospitalization was secondary to 1 overdose in this hospitalization was triggered by him making suicidal threats to family members.  Patient has very limited insight and is minimizing the events. Feels that he is ready to go and is requesting discharge.  Major depressive disorder: I will start the patient on sertraline 25 mg a day. He denies depressive symptoms however this is questionable as he appears to be minimizing in order to be discharged. Patient suffers from HIV, seizures and has marital trouble all which  significantly increase the risk for major depressive disorder.  Insomnia continue trazodone 50 mg by mouth daily at bedtime when necessary  Back pain continue baclofen 10 mg 3 times a day  Seizures continue Tegretol 400 mg twice a day and Keppra 1500 mg twice a day  HIV continue antiviral treatment  Hypertension continue metoprolol 50 mg by mouth daily  Precautions every 15 minute checks  Diet low sodium  Collateral information will be obtained from family.  Approximate length of stay 5-7 days  Jimmy Footman,  MD 07/24/2016, 4:36 PM

## 2016-07-24 NOTE — Plan of Care (Signed)
Problem: Education: Goal: Ability to make informed decisions regarding treatment will improve Outcome: Not Progressing Selectively compliant with scheduled meds.

## 2016-07-24 NOTE — Tx Team (Signed)
Interdisciplinary Treatment and Diagnostic Plan Update  07/24/2016 Time of Session: 1040 Victor Scott MRN: [id number]  Principal Diagnosis: <principal problem not specified>  Secondary Diagnoses: Active Problems:   Severe recurrent major depression (HCC)   Current Medications:  Current Facility-Administered Medications  Medication Dose Route Frequency Provider Last Rate Last Dose  . acetaminophen (TYLENOL) tablet 650 mg  650 mg Oral Q6H PRN Beverly Sessions, MD      . alum & mag hydroxide-simeth (MAALOX/MYLANTA) 200-200-20 MG/5ML suspension 30 mL  30 mL Oral Q4H PRN Beverly Sessions, MD      . aspirin EC tablet 81 mg  81 mg Oral Daily Beverly Sessions, MD   81 mg at 07/24/16 0916  . baclofen (LIORESAL) tablet 10 mg  10 mg Oral TID Beverly Sessions, MD   10 mg at 07/23/16 1648  . carbamazepine (TEGRETOL) tablet 400 mg  400 mg Oral BID AC Beverly Sessions, MD   400 mg at 07/24/16 0916  . darunavir-cobicistat (PREZCOBIX) 800-150 MG per tablet 1 tablet  1 tablet Oral Q breakfast Beverly Sessions, MD      . emtricitabine-tenofovir AF (DESCOVY) 200-25 MG per tablet 1 tablet  1 tablet Oral Daily Beverly Sessions, MD   1 tablet at 07/24/16 1324  . hydrOXYzine (ATARAX/VISTARIL) tablet 25 mg  25 mg Oral TID PRN Beverly Sessions, MD      . levETIRAcetam (KEPPRA) tablet 1,500 mg  1,500 mg Oral BID Beverly Sessions, MD      . magnesium hydroxide (MILK OF MAGNESIA) suspension 30 mL  30 mL Oral Daily PRN Beverly Sessions, MD      . metoprolol succinate (TOPROL-XL) 24 hr tablet 50 mg  50 mg Oral Daily Beverly Sessions, MD   50 mg at 07/24/16 0917  . traZODone (DESYREL) tablet 50 mg  50 mg Oral QHS PRN Beverly Sessions, MD       PTA Medications: Prescriptions Prior to Admission  Medication Sig Dispense Refill Last Dose  . aspirin EC 81 MG tablet Take 81 mg by mouth daily.   07/21/2016 at Unknown time  . baclofen (LIORESAL) 10 MG tablet Take 10 mg by mouth 3 (three) times daily.    07/21/2016 at Unknown time  . carbamazepine (TEGRETOL) 200 MG tablet Take 2 tablets (400 mg total) by mouth 2 (two) times daily before a meal. 120 tablet 1 07/21/2016 at Unknown time  . darunavir-cobicistat (PREZCOBIX) 800-150 MG tablet Take 1 tablet by mouth daily with breakfast. Swallow whole. Do NOT crush, break or chew tablets. Take with food.   07/21/2016 at Unknown time  . emtricitabine-tenofovir AF (DESCOVY) 200-25 MG tablet Take 1 tablet by mouth daily.   07/21/2016 at Unknown time  . meloxicam (MOBIC) 15 MG tablet Take 1 tablet by mouth daily.   07/21/2016 at Unknown time  . metoprolol succinate (TOPROL-XL) 50 MG 24 hr tablet Take 1 tablet (50 mg total) by mouth daily. 15 tablet 0 07/21/2016 at Unknown time    Patient Stressors:    Patient Strengths:    Treatment Modalities: Medication Management, Group therapy, Case management,  1 to 1 session with clinician, Psychoeducation, Recreational therapy.   Physician Treatment Plan for Primary Diagnosis: <principal problem not specified> Long Term Goal(s): Improvement in symptoms so as ready for discharge Improvement in symptoms so as ready for discharge   Short Term Goals: Ability to identify changes in lifestyle to reduce recurrence of condition will improve Ability to verbalize feelings will improve Ability to disclose and discuss suicidal ideas Ability to  demonstrate self-control will improve Ability to identify and develop effective coping behaviors will improve Ability to identify triggers associated with substance abuse/mental health issues will improve Ability to identify changes in lifestyle to reduce recurrence of condition will improve Ability to demonstrate self-control will improve Ability to identify triggers associated with substance abuse/mental health issues will improve  Medication Management: Evaluate patient's response, side effects, and tolerance of medication regimen.  Therapeutic Interventions: 1 to 1 sessions, Unit  Group sessions and Medication administration.  Evaluation of Outcomes: Progressing  Physician Treatment Plan for Secondary Diagnosis: Active Problems:   Severe recurrent major depression (HCC)  Long Term Goal(s): Improvement in symptoms so as ready for discharge Improvement in symptoms so as ready for discharge   Short Term Goals: Ability to identify changes in lifestyle to reduce recurrence of condition will improve Ability to verbalize feelings will improve Ability to disclose and discuss suicidal ideas Ability to demonstrate self-control will improve Ability to identify and develop effective coping behaviors will improve Ability to identify triggers associated with substance abuse/mental health issues will improve Ability to identify changes in lifestyle to reduce recurrence of condition will improve Ability to demonstrate self-control will improve Ability to identify triggers associated with substance abuse/mental health issues will improve     Medication Management: Evaluate patient's response, side effects, and tolerance of medication regimen.  Therapeutic Interventions: 1 to 1 sessions, Unit Group sessions and Medication administration.  Evaluation of Outcomes: Progressing   RN Treatment Plan for Primary Diagnosis: <principal problem not specified> Long Term Goal(s): Knowledge of disease and therapeutic regimen to maintain health will improve  Short Term Goals: Ability to verbalize frustration and anger appropriately will improve, Ability to disclose and discuss suicidal ideas and Compliance with prescribed medications will improve  Medication Management: RN will administer medications as ordered by provider, will assess and evaluate patient's response and provide education to patient for prescribed medication. RN will report any adverse and/or side effects to prescribing provider.  Therapeutic Interventions: 1 on 1 counseling sessions, Psychoeducation, Medication  administration, Evaluate responses to treatment, Monitor vital signs and CBGs as ordered, Perform/monitor CIWA, COWS, AIMS and Fall Risk screenings as ordered, Perform wound care treatments as ordered.  Evaluation of Outcomes: Progressing   LCSW Treatment Plan for Primary Diagnosis: <principal problem not specified> Long Term Goal(s): Safe transition to appropriate next level of care at discharge, Engage patient in therapeutic group addressing interpersonal concerns.  Short Term Goals: Engage patient in aftercare planning with referrals and resources and Increase skills for wellness and recovery  Therapeutic Interventions: Assess for all discharge needs, 1 to 1 time with Social worker, Explore available resources and support systems, Assess for adequacy in community support network, Educate family and significant other(s) on suicide prevention, Complete Psychosocial Assessment, Interpersonal group therapy.  Evaluation of Outcomes: Progressing   Progress in Treatment: Attending groups: Yes. Participating in groups: Yes. Taking medication as prescribed: Yes. Toleration medication: Yes. Family/Significant other contact made: Yes, individual(s) contacted:  mother Patient understands diagnosis: Yes. Discussing patient identified problems/goals with staff: Yes. Medical problems stabilized or resolved: Yes. Denies suicidal/homicidal ideation: Yes. Issues/concerns per patient self-inventory: Yes. Other: none  New problem(s) identified: No, Describe:  none  New Short Term/Long Term Goal(s): Pt goal is to discharge today.  Discharge Plan or Barriers: CSW assessing for appropriate plan.  Reason for Continuation of Hospitalization: Depression Medication stabilization  Estimated Length of Stay:2-3 days.  Attendees: Patient: Victor Scott 07/24/2016   Physician: Dr. Ardyth Harps, MD 07/24/2016   Nursing:  Alberteen SamMandy Stone, RN 07/24/2016   RN Care Manager: 07/24/2016   Social Worker: Daleen SquibbGreg  Kenyanna Grzesiak, LCSW 07/24/2016   Recreational Therapist:  07/24/2016   Other: Lorna Fewhristina Yarbrough, Chaplain 07/24/2016   Other:  07/24/2016   Other: 07/24/2016     Scribe for Treatment Team: Lorri FrederickWierda, [place] Mosco Jon, LCSW 07/24/2016 3:27 PM

## 2016-07-24 NOTE — [facility] Group Notes (Signed)
[facility] LCSW Group Therapy   07/24/2016 9:30am Type of Therapy: Group Therapy   Participation Level: Active   Participation Quality: Attentive, Sharing and Supportive   Affect: Appropriate   Cognitive: Alert and Oriented   Insight: Developing/Improving and Engaged   Engagement in Therapy: Developing/Improving and Engaged   Modes of Intervention: Clarification, Confrontation, Discussion, Education, Exploration,  Limit-setting, Orientation, Problem-solving, Rapport Building, Dance movement psychotherapisteality Testing, Socialization and Support   Summary of Progress/Problems: Pt identified obstacles faced currently and processed barriers involved in overcoming these obstacles. Pt identified steps necessary for overcoming these obstacles and explored motivation (internal and external) for facing these difficulties head on. Pt further identified one area of concern in their lives and chose a goal to focus on for today. Pt appropriate throughout group. Pt stated emotions that led to hospitalization and identified change plan to regulate negative emotions. Pt provided support to other members of group.    Hampton AbbotKadijah Felisia Scott, MSW, LCSW-A 07/24/2016, 10:51AM

## 2016-07-24 NOTE — Tx Team (Signed)
Initial Treatment Plan 07/24/2016 4:13 PM Victor BeltsBartholomew Faerber ZOX:[id number]    PATIENT STRESSORS: Health problems Marital or family conflict   PATIENT STRENGTHS: Average or above average intelligence Capable of independent living   PATIENT IDENTIFIED PROBLEMS: Marital problems  Health concerns                   DISCHARGE CRITERIA:  Improved stabilization in mood, thinking, and/or behavior Verbal commitment to aftercare and medication compliance  PRELIMINARY DISCHARGE PLAN: Outpatient therapy Participate in family therapy  PATIENT/FAMILY INVOLVEMENT: This treatment plan has been presented to and reviewed with the patient, Victor Scott.  The patient and family have been given the opportunity to ask questions and make suggestions.  Jackelyn PolingSunny  Vika Buske, RN 07/24/2016, 4:13 PM

## 2016-07-24 NOTE — Progress Notes (Signed)
Pt visible on unit, observed interacting cheerfully with select staff and peers.  Pt reports he was admitted "because of my wife.  I've never put hands on a woman, but we had a bad argument."  Pt reports he intends to go to his lawyers office immediately after discharge (which he reports is today) and file for divorce.  Refused Keppra, "I take tegretol for seizures, I don't need that."  Refused prezcobix, but accepted descovy.  Refused baclofen.

## 2016-07-25 NOTE — Progress Notes (Signed)
Patient ID: Victor Scott, male   DOB: 10/27/1970, 46 y.o.   MRN: [id number] Visible, interacting well with peers, rough looking appearance, mood and affect flat, labile when I talked with him, denied SI/HI/AVH.

## 2016-07-25 NOTE — Progress Notes (Signed)
CSW spoke with pt about his plans for after he is discharged.  Pt states he is again [place]  Pt shared the events that took place after his discharge last week that led to his readmission and we discussed how texts/comments about suicide are always taken seriously.  Pt reports he understands this and tends to get frustrated when interacting with his wife.  Pt still unsure about his sister driving to pick him up so CSW told him that bus pass can be used to get him to [place]   Garner NashGregory Jrue Yambao, MSW, LCSW Clinical Social Worker 07/25/2016 3:06 PM

## 2016-07-25 NOTE — Plan of Care (Signed)
Problem: Education: Goal: Verbalization of understanding the information provided will improve Outcome: Progressing Voice no concerns around  Unit process

## 2016-07-25 NOTE — Plan of Care (Signed)
Problem: Activity: Goal: Sleeping patterns will improve Outcome: Progressing Voice no concerns around sleep

## 2016-07-25 NOTE — [facility] Group Notes (Signed)
[facility] LCSW Group Therapy Note  Date/Time: 07/25/2016, 9:30 AM  Type of Therapy/Topic:  Group Therapy:  Feelings about Diagnosis  Participation Level:  Active   Mood: Pleasant & Appropriate    Description of Group:    This group will allow patients to explore their thoughts and feelings about diagnoses they have received. Patients will be guided to explore their level of understanding and acceptance of these diagnoses. Facilitator will encourage patients to process their thoughts and feelings about the reactions of others to their diagnosis, and will guide patients in identifying ways to discuss their diagnosis with significant others in their lives. This group will be process-oriented, with patients participating in exploration of their own experiences as well as giving and receiving support and challenge from other group members.   Therapeutic Goals: 1. Patient will demonstrate understanding of diagnosis as evidence by identifying two or more symptoms of the disorder:  2. Patient will be able to express two feelings regarding the diagnosis 3. Patient will demonstrate ability to communicate their needs through discussion and/or role plays  Summary of Patient Progress: Patient identified diagnosis as major depressive disorder. Patient has insight on what major depression is and what it looks like to live with diagnosis. Patient identified coping mechanisms to remain stable and despite triggers. CSW provided support to patient.    Therapeutic Modalities:   Cognitive Behavioral Therapy Brief Therapy Feelings Identification   Hampton AbbotKadijah Kristyana Notte, MSW, LCSW-A 07/25/2016, 10:28AM

## 2016-07-25 NOTE — Plan of Care (Signed)
Problem: Activity: Goal: Sleeping patterns will improve Outcome: Progressing Patient slept for Estimated Hours of 7; Precautionary checks every 15 minutes for safety maintained, room free of safety hazards, patient sustains no injury or falls during this shift.    

## 2016-07-25 NOTE — Plan of Care (Signed)
Problem: Health Behavior/Discharge Planning: Goal: Identification of resources available to assist in meeting health care needs will improve Outcome: Progressing Information on listing outside resources  Given , verbalize understanding   Problem: Coping: Goal: Ability to verbalize frustrations and anger appropriately will improve Outcome: Progressing Able to vent frustration  Goal: Ability to demonstrate self-control will improve Appropriate behavior  Able to come to staff  Problem: Physical Regulation: Goal: Ability to maintain clinical measurements within normal limits will improve Outcome: Progressing Voice no concerns around  Clinical measurement , staff will assist when needed   Problem: Safety: Goal: Periods of time without injury will increase Outcome: Progressing No injury during this admission

## 2016-07-25 NOTE — Plan of Care (Signed)
Problem: Education: Goal: Mental status will improve Patient will be free from suicidal ideations by day 2 of admission  Outcome: Progressing Improved mental status , attending  Unit programing

## 2016-07-25 NOTE — [facility] Group Notes (Signed)
[facility] Group Notes:  (Nursing/MHT/Case Management/Adjunct)  Date:  07/25/2016  Time:  10:39 PM  Type of Therapy:  Group Therapy  Participation Level:  Active  Participation Quality:  Appropriate  Affect:  Appropriate  Cognitive:  Appropriate  Insight:  Appropriate  Engagement in Group:  Engaged  Modes of Intervention:  Discussion  Summary of Progress/Problems:  Victor Scott Victor Scott 07/25/2016, 10:39 PM 

## 2016-07-25 NOTE — [facility] Group Notes (Signed)
[facility] Group Notes:  (Nursing/MHT/Case Management/Adjunct)  Date:  07/25/2016  Time:  2:34 PM  Type of Therapy:  Psychoeducational Skills  Participation Level:  Minimal  Participation Quality:  Inattentive  Affect:  Resistant  Cognitive:  Oriented  Insight:  Appropriate  Engagement in Group:  None  Modes of Intervention:  Socialization  Summary of Progress/Problems:  [name] 07/25/2016, 2:34 PM

## 2016-07-25 NOTE — Plan of Care (Signed)
Problem: Education: Goal: Knowledge of [place] [facility] Education information/materials will improve Verbalize understanding of information received

## 2016-07-25 NOTE — Plan of Care (Signed)
Problem: Activity: Goal: Interest or engagement in activities will improve Pt will engage in unit activities within 2 days  Outcome: Progressing Attending unit programing

## 2016-07-25 NOTE — Progress Notes (Signed)
[facility] MD Progress Note  07/25/2016 8:37 AM Victor Scott  MRN:  [id number] Subjective:  Pt with adjustment disorder was discharged from this unit 2 days ago on 13th after 1 day stay. Pt presenting to the ED again under IVC by [facility]. [place] his ex girlfriend and , his wife  when the police came and picked him up. Per report, his wife told police he was texting her stating he was going to hang himself and had   Pt initially denied these allegations to other staff, now states he "didn't mean to hurt himself". States he should not be in the hospital, anxious and upset that he cannot leave and missing visit to his grandchildren.  He denies SI/HI and any auditory/visual hallucinations. Denies depression. Per report, pt had some alcohol during texting, denies drinking any alcohol to me, alcohol level <5.  UDS- positive for THC. Pt hoping to improve relationship with his wife, states he is staying with his mom for last 2 months.   Per record,  For the past several months the patient has been experiencing marital problems and has been separated from his wife. On the day of admission he overdosed on metoprolol that is prescribed to him for hypertension but immediately called a friend and was brought to the hospital. He denies suicidal intention and explained that he was trying to get his wife's attention. Indeed she visited him in the emergency room. The patient realizes that he has been less than ideal husband, not wanting to talk to his wife about these problems, and withholding self-critical health information from her. During the past 2 months, he did a lot of soul searching, started reading the Bible, but connected with his church friends and decided that he would do what is necessary to reveal his marriage. He denies symptoms of depression, anxiety, psychosis or symptoms suggestive of bipolar disorder. This overdose was impulsive and did not have  prior suicidal thoughts he smokes marijuana regularly but denies alcohol, illicit substance, or prescription pill abuse.  7/16 the patient was seen in treatment team today. He said he is doing very well. He denies any depressive symptoms or problems with his sleep, appetite, energy or concentration. He denies any suicidality, homicidality or auditory or visual hallucinations. The patient feels that there is no reason for him to be in the hospital. She is requesting to be discharged today. He denies having any psychiatric needs at this point.  Patient was just discharged from the unit after an overdose. He returns after telling family he was going to commit suicide by hanging.   Patient minimizes these issues. Doesn't seem to see the gravity of the events and the fact that he is being hospitalized twice back to back.  7/17 patient says he is feeling well. He has been participating in groups. He denies problem with his sleep, appetite, energy or concentration. Denies suicidality, homicidality or auditory or visual hallucinations. Patient eats okay with continuing the antidepressant. He acknowledges that he's been feeling sad lately.    Per nursing: Patient slept for Estimated Hours of 7; Precautionary checks every 15 minutes for safety maintained, room free of safety hazards, patient sustains no injury or falls during this shift.  Visible, interacting well with peers, rough looking appearance, mood and affect flat, labile when I talked with him, denied SI/HI/AVH.   Principal Problem: Severe recurrent major depression (HCC) Diagnosis:   Patient Active Problem List   Diagnosis Date  Noted  . Severe recurrent major depression (HCC) [F33.2] 07/22/2016  . HTN (hypertension) [I10] 07/21/2016  . Cannabis use disorder, moderate, dependence (HCC) [F12.20] 07/21/2016  . Tobacco use disorder [F17.200] 07/21/2016  . HIV (human immunodeficiency virus infection) (HCC) [B20] 07/19/2016  . Seizures (HCC)  [R56.9] 07/19/2016   Total Time spent with patient: 30 minutes  .  Past Psychiatric History: Just d/ced from [facility] on 7/13 after 1 night stay for adjustment issues with his wife. No other psych  hospitalization, No h/o being treated for mental illness, or attempted suicide. He has past history of cocaine use but has been clean of heart substances for years. He has been in prison several times for a total of 21 years beginning at the age of 11. He has never been in mental health while incarcerated.  Past Medical History:  Past Medical History:  Diagnosis Date  . Depression   . HIV (human immunodeficiency virus infection) (HCC)   . Hypertension   . Seizures (HCC)     Past Surgical History:  Procedure Laterality Date  . NO PAST SURGERIES    . none     Family History:  Family History  Problem Relation Age of Onset  . Hypertension Father    Family Psychiatric  History: His father and 2 brothers died of alcoholism  Social History: He has been married for over a year ago to a woman is known for several years. He was not forthcoming about his HIV status with her and it created a rift as she has learned about it from Dr. Sampson Goon when the patient was hospitalized for seizures in the fall. He has been seizure-free for 6 months and is not allowed to return to work. 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 It is difficult for him to get a job given his criminal past. He has a son who is 32 years old and has 6 grandchildren. There are no current legal charges pending. History  Alcohol Use No     History  Drug Use  . Types: Marijuana    Comment: denies [id number]    Social History   Social History  . Marital status: Married    Spouse name: N/A  . Number of children: N/A  . Years of education: N/A   Social History Main Topics  . Smoking status: Current Every Day Smoker    Packs/day: 1.00    Types: Cigarettes  . Smokeless tobacco: Never Used  . Alcohol use No  . Drug use:  Yes    Types: Marijuana     Comment: denies [id number]  . Sexual activity: Yes     Comment: has HIV   Other Topics Concern  . None   Social History Narrative  . None   Additional Social History:    Pain Medications: marijuanna      Current Medications: Current Facility-Administered Medications  Medication Dose Route Frequency Provider Last Rate Last Dose  . acetaminophen (TYLENOL) tablet 650 mg  650 mg Oral Q6H PRN Beverly Sessions, MD      . alum & mag hydroxide-simeth (MAALOX/MYLANTA) 200-200-20 MG/5ML suspension 30 mL  30 mL Oral Q4H PRN Beverly Sessions, MD      . aspirin EC tablet 81 mg  81 mg Oral Daily Beverly Sessions, MD   81 mg at 07/25/16 0831  . baclofen (LIORESAL) tablet 10 mg  10 mg Oral TID Beverly Sessions, MD   10 mg at 07/24/16 1709  . carbamazepine (TEGRETOL) tablet 400  mg  400 mg Oral BID AC Beverly SessionsSubedi, Jagannath, MD   400 mg at 07/25/16 0824  . darunavir-cobicistat (PREZCOBIX) 800-150 MG per tablet 1 tablet  1 tablet Oral Q breakfast Beverly SessionsSubedi, Jagannath, MD      . emtricitabine-tenofovir AF (DESCOVY) 200-25 MG per tablet 1 tablet  1 tablet Oral Daily Beverly SessionsSubedi, Jagannath, MD   1 tablet at 07/25/16 [id number]  . hydrOXYzine (ATARAX/VISTARIL) tablet 25 mg  25 mg Oral TID PRN Beverly SessionsSubedi, Jagannath, MD      . levETIRAcetam (KEPPRA) tablet 1,500 mg  1,500 mg Oral BID Beverly SessionsSubedi, Jagannath, MD      . magnesium hydroxide (MILK OF MAGNESIA) suspension 30 mL  30 mL Oral Daily PRN Beverly SessionsSubedi, Jagannath, MD      . metoprolol succinate (TOPROL-XL) 24 hr tablet 50 mg  50 mg Oral Daily Beverly SessionsSubedi, Jagannath, MD   50 mg at 07/25/16 0824  . nicotine (NICODERM CQ - dosed in mg/24 hours) patch 21 mg  21 mg Transdermal Daily Jimmy FootmanHernandez-Gonzalez, [place] Hedman, MD   21 mg at 07/25/16 0835  . sertraline (ZOLOFT) tablet 25 mg  25 mg Oral Daily Jimmy FootmanHernandez-Gonzalez, Brenda Samano, MD   25 mg at 07/25/16 0824  . traZODone (DESYREL) tablet 50 mg  50 mg Oral QHS PRN Beverly SessionsSubedi, Jagannath, MD        Lab Results: No results found for  this or any previous visit (from the past 48 hour(s)).  Blood Alcohol level:  Lab Results  Component Value Date   ETH <5 07/22/2016   ETH <5 07/19/2016    Metabolic Disorder Labs: Lab Results  Component Value Date   HGBA1C 5.3 07/21/2016   MPG 105 07/21/2016   No results found for: PROLACTIN Lab Results  Component Value Date   CHOL 161 07/21/2016   TRIG 213 (H) 07/21/2016   HDL 21 (L) 07/21/2016   CHOLHDL 7.7 07/21/2016   VLDL 43 (H) 07/21/2016   LDLCALC 97 07/21/2016    Physical Findings: AIMS: Facial and Oral Movements Muscles of Facial Expression: None, normal Lips and Perioral Area: None, normal Jaw: None, normal Tongue: None, normal,Extremity Movements Upper (arms, wrists, hands, fingers): None, normal Lower (legs, knees, ankles, toes): None, normal, Trunk Movements Neck, shoulders, hips: None, normal, Overall Severity Severity of abnormal movements (highest score from questions above): None, normal Incapacitation due to abnormal movements: None, normal Patient's awareness of abnormal movements (rate only patient's report): No Awareness, Dental Status Current problems with teeth and/or dentures?: No Does patient usually wear dentures?: No  CIWA:  CIWA-Ar Total: 0 COWS:     Musculoskeletal: Strength & Muscle Tone: within normal limits Gait & Station: normal Patient leans: N/A  Psychiatric Specialty Exam: Physical Exam  Constitutional: He is oriented to person, place, and time. He appears well-developed and well-nourished.  HENT:  Head: Normocephalic and atraumatic.  Eyes: Conjunctivae and EOM are normal.  Respiratory: Effort normal.  Musculoskeletal: Normal range of motion.  Neurological: He is alert and oriented to person, place, and time.    Review of Systems  Psychiatric/Behavioral: Negative for depression, hallucinations, memory loss, substance abuse and suicidal ideas. The patient is not nervous/anxious and does not have insomnia.   All other  systems reviewed and are negative.   Blood pressure (!) 154/93, pulse (!) 56, temperature 98.1 F (36.7 C), temperature source Oral, resp. rate 19, height 6\' 3"  (1.905 m), weight (!) 149.7 kg (330 lb), SpO2 100 %.Body mass index is 41.25 kg/m.  General Appearance: Well Groomed  Eye Contact:  Good  Speech:  Clear and Coherent  Volume:  Normal  Mood:  Euthymic  Affect:  Appropriate and Congruent  Thought Process:  Linear and Descriptions of Associations: Intact  Orientation:  Full (Time, Place, and Person)  Thought Content:  Hallucinations: None  Suicidal Thoughts:  No  Homicidal Thoughts:  No  Memory:  Immediate;   Fair Recent;   Fair Remote;   Fair  Judgement:  Poor  Insight:  Shallow  Psychomotor Activity:  Normal  Concentration:  Concentration: Fair and Attention Span: Fair  Recall:  Good  Fund of Knowledge:  Fair  Language:  Good  Akathisia:  No  Handed:    AIMS (if indicated):     Assets:  Financial Resources/Insurance Housing  ADL's:  Intact  Cognition:  WNL  Sleep:  Number of Hours: 7     Treatment Plan Summary: Daily contact with patient to assess and evaluate symptoms and progress in treatment and Medication management   Patient with HIV and seizures. Admitted for the second time to our unit this month. The first hospitalization was secondary to 1 overdose in this hospitalization was triggered by him making suicidal threats to family members.  Patient has very limited insight and is minimizing the events. Feels that he is ready to go and is requesting discharge.  Major depressive disorder:started the patient on sertraline 25 mg a day. He denies depressive symptoms however this is questionable as he appears to be minimizing in order to be discharged. Patient suffers from HIV, seizures and has marital trouble all which  significantly increase the risk for major depressive disorder.  Insomnia continue trazodone 50 mg by mouth daily at bedtime when necessary---slept  well last night  Back pain continue baclofen 10 mg 3 times a day  Seizures continue Tegretol 400 mg twice a day and Keppra 1500 mg twice a day. Will check a Tegretol level prior to discharge  HIV continue antiviral treatment  Hypertension continue metoprolol 50 mg by mouth daily.  Will also change diet to low sodium  Precautions every 15 minute checks  Diet low sodium  Collateral information will be obtained from family.  Possible discharge in the next 2-3 days.  Jimmy Footman, MD 07/25/2016, 8:37 AM

## 2016-07-25 NOTE — Plan of Care (Signed)
Problem: Education: Goal: Verbalization of understanding the information provided will improve Outcome: Not Progressing No concerns  Voiced on unit programing

## 2016-07-25 NOTE — Plan of Care (Signed)
Problem: Education: Goal: Emotional status will improve Patient will participate in treatment plan by day 2 of admission  Outcome: Progressing Attending unit treatment plan

## 2016-07-25 NOTE — Progress Notes (Signed)
D: Patient stated slept good last night .Stated appetite is good and energy level  Is normal. Stated concentration is good . Stated on Depression  Scale 0 , hopeless and anxiety 0 .( low 0-10 high) Denies suicidal  homicidal ideations  .  No auditory hallucinations  No pain concerns . Appropriate ADL'S. Interacting with peers and staff. Voice of working on his attitude and ego. A: Encourage patient participation with unit programming . Instruction  Given on  Medication , verbalize understanding. R: Voice no other concerns. Staff continue to monitor

## 2016-07-26 MED ORDER — BACLOFEN 10 MG PO TABS
10.0000 mg | ORAL_TABLET | Freq: Every day | ORAL | Status: DC
  Filled 2016-07-26 (×2): qty 1

## 2016-07-26 NOTE — [facility] Group Notes (Signed)
[facility] LCSW Group Therapy   07/26/2016 9:30 am Type of Therapy: Group Therapy   Participation Level: Active   Participation Quality: Attentive, Sharing and Supportive   Affect: Appropriate   Cognitive: Alert and Oriented   Insight: Developing/Improving and Engaged   Engagement in Therapy: Developing/Improving and Engaged   Modes of Intervention: Clarification, Confrontation, Discussion, Education, Exploration,  Limit-setting, Orientation, Problem-solving, Rapport Building, Dance movement psychotherapisteality Testing, Socialization and Support   Summary of Progress/Problems: Pt identified obstacles faced currently and processed barriers involved in overcoming these obstacles. Pt identified steps necessary for overcoming these obstacles and explored motivation (internal and external) for facing these difficulties head on. Pt further identified one area of concern in their lives and chose a goal to focus on for today. Patient defined the term "obstacle" and current obstacles that led to hospitalization. Patient identified what he needs in order to overcome his current obstacles. CSW provided support to patient.  Hampton AbbotKadijah Edgel Degnan, MSW, LCSW-A 07/26/2016, 10:32AM

## 2016-07-26 NOTE — Progress Notes (Signed)
D: Patient stated slept good last night .Stated appetite is good and energy level  Is normal. Stated concentration is good . Stated on Depression scale ,0 hopeless 0 and anxiety 0 .( low 0-10 high) Denies suicidal  homicidal ideations  .  No auditory hallucinations  No pain concerns . Appropriate ADL'S. Interacting with peers and staff. Attending unit programing. Very little said about the period of time spent before getting here. A: Encourage patient participation with unit programming . Instruction  Given on  Medication , verbalize understanding. R: Voice no other concerns. Staff continue to monitor

## 2016-07-26 NOTE — Plan of Care (Signed)
Problem: Activity: Goal: Interest or engagement in activities will improve Pt will engage in unit activities within 2 days  Outcome: Progressing Attending  Unit programing  Goal: Sleeping patterns will improve Outcome: Progressing Voice no other concerns around  Sleep   Problem: Education: Goal: Knowledge of [facility] Education information/materials will improve Outcome: Progressing Verbalize understanding of information received  Goal: Emotional status will improve Patient will participate in treatment plan by day 2 of admission  Outcome: Progressing Affect cheerful on approach Goal: Mental status will improve Patient will be free from suicidal ideations by day 2 of admission  Outcome: Progressing Attending unit programing, verbalization of feelings  Goal: Verbalization of understanding the information provided will improve Outcome: Progressing Verbalize understanding of information received  Problem: Health Behavior/Discharge Planning: Goal: Identification of resources available to assist in meeting health care needs will improve  Staff will explore  recsources available  For her health needs  Goal: Compliance with treatment plan for underlying cause of condition will improve Working on stressors   Problem: Coping: Goal: Ability to verbalize frustrations and anger appropriately will improve Patient able to vent feeling during group therapy. Goal: Ability to demonstrate self-control will improve Outcome: Progressing No outburst during admission  Problem: Physical Regulation: Goal: Ability to maintain clinical measurements within normal limits will improve Outcome: Progressing Continue to cope using  Coping  Skills   Problem: Safety: Goal: Periods of time without injury will increase Outcome: Progressing No problem with gait

## 2016-07-26 NOTE — Progress Notes (Signed)
Patient ID: Victor Scott, male   DOB: 09/06/1970, 46 y.o.   MRN: [id number] Low key profile, improved appearance, bright, happy, getting along with peers, denied any pain, denied SI/HI/AVH.

## 2016-07-26 NOTE — Progress Notes (Signed)
[facility] MD Progress Note  07/26/2016 8:32 AM Jonell Krontz  MRN:  [id number] Subjective:  Pt with adjustment disorder was discharged from this unit 2 days ago on 13th after 1 day stay. Pt presenting to the ED again under IVC by [facility]. [place]  was [place]ing his ex girlfriend and , his wife  when the police came and picked him up. Per report, his wife told police he was texting her stating he was going to hang himself and had   Pt initially denied these allegations to other staff, now states he "didn't mean to hurt himself". States he should not be in the hospital, anxious and upset that he cannot leave and missing visit to his grandchildren.  He denies SI/HI and any auditory/visual hallucinations. Denies depression. Per report, pt had some alcohol during texting, denies drinking any alcohol to me, alcohol level <5.  UDS- positive for THC. Pt hoping to improve relationship with his wife, states he is staying with his mom for last 2 months.   Per record,  For the past several months the patient has been experiencing marital problems and has been separated from his wife. On the day of admission he overdosed on metoprolol that is prescribed to him for hypertension but immediately called a friend and was brought to the hospital. He denies suicidal intention and explained that he was trying to get his wife's attention. Indeed she visited him in the emergency room. The patient realizes that he has been less than ideal husband, not wanting to talk to his wife about these problems, and withholding self-critical health information from her. During the past 2 months, he did a lot of soul searching, started reading the Bible, but connected with his church friends and decided that he would do what is necessary to reveal his marriage. He denies symptoms of depression, anxiety, psychosis or symptoms suggestive of bipolar disorder. This overdose was impulsive and did not have  prior suicidal thoughts he smokes marijuana regularly but denies alcohol, illicit substance, or prescription pill abuse.  7/16 the patient was seen in treatment team today. He said he is doing very well. He denies any depressive symptoms or problems with his sleep, appetite, energy or concentration. He denies any suicidality, homicidality or auditory or visual hallucinations. The patient feels that there is no reason for him to be in the hospital. She is requesting to be discharged today. He denies having any psychiatric needs at this point.  Patient was just discharged from the unit after an overdose. He returns after telling family he was going to commit suicide by hanging.   Patient minimizes these issues. Doesn't seem to see the gravity of the events and the fact that he is being hospitalized twice back to back.  7/17 patient says he is feeling well. He has been participating in groups. He denies problem with his sleep, appetite, energy or concentration. Denies suicidality, homicidality or auditory or visual hallucinations. Patient eats okay with continuing the antidepressant. He acknowledges that he's been feeling sad lately.   7/18 patient says he is doing better. He is very concerned about him and the problems in the relationship with his wife. He says that he has been avoiding talking to her but he knows he needs to address that before he goes home. He plans to call her today and try to talk to her to prevent from things "blowing up in his face" once discharged.  He denies suicidality, homicidality or  auditory or visual hallucinations. Patient denies side effects from medications or physical complaints. He has been eating and sleeping well   Per nursing:  Patient slept for Estimated Hours of 7; Precautionary checks every 15 minutes for safety maintained, room free of safety hazards, patient sustains no injury or falls during this shift.  Low key profile, improved appearance, bright, happy,  getting along with peers, denied any pain, denied SI/HI/AVH.  Principal Problem: Severe recurrent major depression (HCC) Diagnosis:   Patient Active Problem List   Diagnosis Date Noted  . Severe recurrent major depression (HCC) [F33.2] 07/22/2016  . HTN (hypertension) [I10] 07/21/2016  . Cannabis use disorder, moderate, dependence (HCC) [F12.20] 07/21/2016  . Tobacco use disorder [F17.200] 07/21/2016  . HIV (human immunodeficiency virus infection) (HCC) [B20] 07/19/2016  . Seizures (HCC) [R56.9] 07/19/2016   Total Time spent with patient: 30 minutes  .  Past Psychiatric History: [facility] 1 night stay for adjustment issues with his wife. No other psych  hospitalization, No h/o being treated for mental illness, or attempted suicide. He has past history of cocaine use but has been clean of heart substances for years. He has been in prison several times for a total of 21 years beginning at the age of 46. He has never been in mental health while incarcerated.  Past Medical History:  Past Medical History:  Diagnosis Date  . Depression   . HIV (human immunodeficiency virus infection) (HCC)   . Hypertension   . Seizures (HCC)     Past Surgical History:  Procedure Laterality Date  . NO PAST SURGERIES    . none     Family History:  Family History  Problem Relation Age of Onset  . Hypertension Father    Family Psychiatric  History: His father and 2 brothers died of alcoholism  Social History: He has been married for over a year ago to a woman is known for several years. He was not forthcoming about his HIV status with her and it created a rift as she has learned about it from Dr. Sampson GoonFitzgerald when the patient was hospitalized for seizures in the fall. He has been seizure-free for 6 months and is not allowed to return to work. 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 It is difficult for him to get a job given his criminal past. He has a son who is 45 years  old and has 6 grandchildren. There are no current legal charges pending. History  Alcohol Use No     History  Drug Use  . Types: Marijuana    Comment: denies [id number]    Social History   Social History  . Marital status: Married    Spouse name: N/A  . Number of children: N/A  . Years of education: N/A   Social History Main Topics  . Smoking status: Current Every Day Smoker    Packs/day: 1.00    Types: Cigarettes  . Smokeless tobacco: Never Used  . Alcohol use No  . Drug use: Yes    Types: Marijuana     Comment: denies [id number]  . Sexual activity: Yes     Comment: has HIV   Other Topics Concern  . None   Social History Narrative  . None   Additional Social History:    Pain Medications: marijuanna      Current Medications: Current Facility-Administered Medications  Medication Dose Route Frequency Provider Last Rate Last Dose  . acetaminophen (TYLENOL) tablet  650 mg  650 mg Oral Q6H PRN Beverly Sessions, MD      . alum & mag hydroxide-simeth (MAALOX/MYLANTA) 200-200-20 MG/5ML suspension 30 mL  30 mL Oral Q4H PRN Beverly Sessions, MD      . aspirin EC tablet 81 mg  81 mg Oral Daily Beverly Sessions, MD   81 mg at 07/26/16 0831  . baclofen (LIORESAL) tablet 10 mg  10 mg Oral TID Beverly Sessions, MD   10 mg at 07/24/16 1709  . carbamazepine (TEGRETOL) tablet 400 mg  400 mg Oral BID AC Beverly Sessions, MD   400 mg at 07/26/16 0831  . darunavir-cobicistat (PREZCOBIX) 800-150 MG per tablet 1 tablet  1 tablet Oral Q breakfast Beverly Sessions, MD      . emtricitabine-tenofovir AF (DESCOVY) 200-25 MG per tablet 1 tablet  1 tablet Oral Daily Beverly Sessions, MD   1 tablet at 07/26/16 0830  . hydrOXYzine (ATARAX/VISTARIL) tablet 25 mg  25 mg Oral TID PRN Beverly Sessions, MD      . levETIRAcetam (KEPPRA) tablet 1,500 mg  1,500 mg Oral BID Beverly Sessions, MD      . magnesium hydroxide (MILK OF MAGNESIA) suspension 30 mL  30 mL Oral Daily PRN Beverly Sessions, MD       . metoprolol succinate (TOPROL-XL) 24 hr tablet 50 mg  50 mg Oral Daily Beverly Sessions, MD   50 mg at 07/26/16 0830  . nicotine (NICODERM CQ - dosed in mg/24 hours) patch 21 mg  21 mg Transdermal Daily Jimmy Footman, MD   21 mg at 07/25/16 0835  . sertraline (ZOLOFT) tablet 25 mg  25 mg Oral Daily Jimmy Footman, MD   25 mg at 07/26/16 0830  . traZODone (DESYREL) tablet 50 mg  50 mg Oral QHS PRN Beverly Sessions, MD        Lab Results: No results found for this or any previous visit (from the past 48 hour(s)).  Blood Alcohol level:  Lab Results  Component Value Date   ETH <5 07/22/2016   ETH <5 07/19/2016    Metabolic Disorder Labs: Lab Results  Component Value Date   HGBA1C 5.3 07/21/2016   MPG 105 07/21/2016   No results found for: PROLACTIN Lab Results  Component Value Date   CHOL 161 07/21/2016   TRIG 213 (H) 07/21/2016   HDL 21 (L) 07/21/2016   CHOLHDL 7.7 07/21/2016   VLDL 43 (H) 07/21/2016   LDLCALC 97 07/21/2016    Physical Findings: AIMS: Facial and Oral Movements Muscles of Facial Expression: None, normal Lips and Perioral Area: None, normal Jaw: None, normal Tongue: None, normal,Extremity Movements Upper (arms, wrists, hands, fingers): None, normal Lower (legs, knees, ankles, toes): None, normal, Trunk Movements Neck, shoulders, hips: None, normal, Overall Severity Severity of abnormal movements (highest score from questions above): None, normal Incapacitation due to abnormal movements: None, normal Patient's awareness of abnormal movements (rate only patient's report): No Awareness, Dental Status Current problems with teeth and/or dentures?: No Does patient usually wear dentures?: No  CIWA:  CIWA-Ar Total: 0 COWS:     Musculoskeletal: Strength & Muscle Tone: within normal limits Gait & Station: normal Patient leans: N/A  Psychiatric Specialty Exam: Physical Exam  Constitutional: He is oriented to person, place, and  time. He appears well-developed and well-nourished.  HENT:  Head: Normocephalic and atraumatic.  Eyes: Conjunctivae and EOM are normal.  Respiratory: Effort normal.  Musculoskeletal: Normal range of motion.  Neurological: He is alert and oriented to person, place,  and time.    Review of Systems  Neurological: Negative.   Endo/Heme/Allergies: Negative.   Psychiatric/Behavioral: Negative.  Negative for depression, hallucinations, memory loss, substance abuse and suicidal ideas. The patient is not nervous/anxious and does not have insomnia.   All other systems reviewed and are negative.   Blood pressure (!) 150/95, pulse 66, temperature 97.7 F (36.5 C), temperature source Oral, resp. rate 20, height 6\' 3"  (1.905 m), weight (!) 149.7 kg (330 lb), SpO2 100 %.Body mass index is 41.25 kg/m.  General Appearance: Well Groomed  Eye Contact:  Good  Speech:  Clear and Coherent  Volume:  Normal  Mood:  Euthymic  Affect:  Appropriate and Congruent  Thought Process:  Linear and Descriptions of Associations: Intact  Orientation:  Full (Time, Place, and Person)  Thought Content:  Hallucinations: None  Suicidal Thoughts:  No  Homicidal Thoughts:  No  Memory:  Immediate;   Fair Recent;   Fair Remote;   Fair  Judgement:  Poor  Insight:  Shallow  Psychomotor Activity:  Normal  Concentration:  Concentration: Fair and Attention Span: Fair  Recall:  Good  Fund of Knowledge:  Fair  Language:  Good  Akathisia:  No  Handed:    AIMS (if indicated):     Assets:  Financial Resources/Insurance Housing  ADL's:  Intact  Cognition:  WNL  Sleep:  Number of Hours: 7     Treatment Plan Summary: Daily contact with patient to assess and evaluate symptoms and progress in treatment and Medication management   Patient with HIV and seizures. Admitted for the second time to our unit this month. The first hospitalization was secondary to 1 overdose in this hospitalization was triggered by him making suicidal  threats to family members.  Patient has very limited insight and is minimizing the events. Feels that he is ready to go and is requesting discharge.  Major depressive disorder:started the patient on sertraline 25 mg a day. He denies depressive symptoms however this is questionable as he appears to be minimizing in order to be discharged. Patient suffers from HIV, seizures and has marital trouble all which  significantly increase the risk for major depressive disorder.  Insomnia continue trazodone 50 mg by mouth daily at bedtime when necessary---slept 7h last night  Back pain: Per his request I will change the baclofen 2010 mg at bedtime  Seizures continue Tegretol 400 mg twice a day. We will need a Tegretol level prior to discharge. Patient says he's not to take Keppra only Tegretol  HIV continue antiviral treatment  Hypertension continue metoprolol 50 mg by mouth daily. Continue low sodium diet  Precautions every 15 minute checks  Diet low sodium  Collateral information will be obtained from family.  Possible discharge in the next 1-2 days  Jimmy Footman, MD 07/26/2016, 8:32 AM

## 2016-07-26 NOTE — Plan of Care (Signed)
Problem: Activity: Goal: Sleeping patterns will improve Outcome: Progressing Patient slept for Estimated Hours of 7; Precautionary checks every 15 minutes for safety maintained, room free of safety hazards, patient sustains no injury or falls during this shift.    

## 2016-07-26 NOTE — Progress Notes (Signed)
   07/26/16 1400  Clinical Encounter Type  Visited With Patient  Visit Type Spiritual support   [facility]

## 2016-07-27 MED ORDER — BACLOFEN 10 MG PO TABS: 10.0000 mg | ORAL_TABLET | Freq: Every evening | ORAL | 0 refills | Status: AC | PRN

## 2016-07-27 MED ORDER — SERTRALINE HCL 50 MG PO TABS: 50.0000 mg | ORAL_TABLET | Freq: Every day | ORAL | 0 refills | Status: AC

## 2016-07-27 NOTE — Progress Notes (Signed)
Patient ID: Victor Scott, male   DOB: [birth year], 46 y.o.   MRN: [id number] Pleasant on approach, quiet, polite, appropriate, no abnormal behaviors, appears to be functioning at baseline, denied SI/HI/AVH.

## 2016-07-27 NOTE — [facility] Group Notes (Signed)
Goals Group  Date/Time: 07/27/2016, 9:00 AM Type of Therapy and Topic: Group Therapy: Goals Group: SMART Goals  ?  Participation Level: Moderate  ?  Description of Group:  ?  The purpose of a daily goals group is to assist and guide patients in setting recovery/wellness-related goals. The objective is to set goals as they relate to the crisis in which they were admitted. Patients will be using SMART goal modalities to set measurable goals. Characteristics of realistic goals will be discussed and patients will be assisted in setting and processing how one will reach their goal. Facilitator will also assist patients in applying interventions and coping skills learned in psycho-education groups to the SMART goal and process how one will achieve defined goal.  ?  Therapeutic Goals:  ?  -Patients will develop and document one goal related to or their crisis in which brought them into treatment.  -Patients will be guided by LCSW using SMART goal setting modality in how to set a measurable, attainable, realistic and time sensitive goal.  -Patients will process barriers in reaching goal.  -Patients will process interventions in how to overcome and successful in reaching goal.  ?  Patient's Goal: Pt stated that his goal is to "talk to wife about decision making." ?  Therapeutic Modalities:  Motivational Interviewing  Cognitive Behavioral Therapy  Crisis Intervention Model  SMART goals setting  Hampton AbbotKadijah Brantleigh Mifflin, MSW, LCSW-A 07/27/2016, 10:24AM

## 2016-07-27 NOTE — Progress Notes (Signed)
Pt denies SI, HI, a/v hallucinations. Pt is medication, meal and group compliant. Pt is calm and cooperative. Pt visible on unit and seen interacting well with staff and peers. Will continue to monitor for safety.

## 2016-07-27 NOTE — Progress Notes (Addendum)
[name]  07/27/2016 1:03 PM Victor Scott  MRN:  [id number] Subjective:  Pt with adjustment disorder was discharged from this unit 2 days ago on 13th after 1 day stay. [place] [facility] [place] [facility] [place] [name]. [place]  was texting his ex girlfriend and , his wife  when the police came and picked him up. Per report, his wife told police he was texting her stating he was going to hang himself and had   Pt initially denied these allegations to other staff, now states he "didn't mean to hurt himself". States he should not be in the hospital, anxious and upset that he cannot leave and missing visit to his grandchildren.  He denies SI/HI and any auditory/visual hallucinations. Denies depression. Per report, pt had some alcohol during texting, denies drinking any alcohol to me, alcohol level <5.  UDS- positive for THC. Pt hoping to improve relationship with his wife, states he is staying with his mom for last 2 months.   Per record,  For the past several months the patient has been experiencing marital problems and has been separated from his wife. On the day of admission he overdosed on metoprolol that is prescribed to him for hypertension but immediately called a friend and was brought to the hospital. He denies suicidal intention and explained that he was trying to get his wife's attention. Indeed she visited him in the emergency room. The patient realizes that he has been less than ideal husband, not wanting to talk to his wife about these problems, and withholding self-critical health information from her. During the past 2 months, he did a lot of soul searching, started reading the Bible, but connected with his church friends and decided that he would do what is necessary to reveal his marriage. He denies symptoms of depression, anxiety, psychosis or symptoms suggestive of bipolar disorder. This overdose was impulsive and did not have  prior suicidal thoughts he smokes marijuana regularly but denies alcohol, illicit substance, or prescription pill abuse.  7/16 the patient was seen in treatment team today. He said he is doing very well. He denies any depressive symptoms or problems with his sleep, appetite, energy or concentration. He denies any suicidality, homicidality or auditory or visual hallucinations. The patient feels that there is no reason for him to be in the hospital. She is requesting to be discharged today. He denies having any psychiatric needs at this point.  Patient was just discharged from the unit after an overdose. He returns after telling family he was going to commit suicide by hanging.   Patient minimizes these issues. Doesn't seem to see the gravity of the events and the fact that he is being hospitalized twice back to back.  7/17 patient says he is feeling well. He has been participating in groups. He denies problem with his sleep, appetite, energy or concentration. Denies suicidality, homicidality or auditory or visual hallucinations. Patient eats okay with continuing the antidepressant. He acknowledges that he's been feeling sad lately.   7/18 patient says he is doing better. He is very concerned about him and the problems in the relationship with his wife. He says that he has been avoiding talking to her but he knows he needs to address that before he goes home. He plans to call her today and try to talk to her to prevent from things "blowing up in his face" once discharged.  He denies suicidality, homicidality or  auditory or visual hallucinations. Patient denies side effects from medications or physical complaints. He has been eating and sleeping well  7/19 patient still has not been able to talk to his wife. He says that when discharged he is not going to moving back with her he is going to stay with his mother. Currently he feels hopeful and future oriented. He denies suicidality, homicidality or  hallucinations. He is tolerating medications well. He is sleeping and eating well. He has been actively participating in groups.   Per nursing: Pleasant on approach, quiet, polite, appropriate, no abnormal behaviors, appears to be functioning at baseline, denied SI/HI/AVH.   Principal Problem: Severe recurrent major depression (HCC) Diagnosis:   Patient Active Problem List   Diagnosis Date Noted  . Severe recurrent major depression (HCC) [F33.2] 07/22/2016  . HTN (hypertension) [I10] 07/21/2016  . Cannabis use disorder, moderate, dependence (HCC) [F12.20] 07/21/2016  . Tobacco use disorder [F17.200] 07/21/2016  . HIV (human immunodeficiency virus infection) (HCC) [B20] 07/19/2016  . Seizures (HCC) [R56.9] 07/19/2016   Total Time spent with patient: 30 minutes  .  Past Psychiatric History: Just d/ced from [facility] on [facility] after 1 night stay for adjustment issues with his wife. No other psych  hospitalization, No h/o being treated for mental illness, or attempted suicide. He has past history of cocaine use but has been clean of heart substances for years. He has been in prison several times for a total of 21 years beginning at the age of 53. He has never been in mental health while incarcerated.  Past Medical History:  Past Medical History:  Diagnosis Date  . Depression   . HIV (human immunodeficiency virus infection) (HCC)   . Hypertension   . Seizures (HCC)     Past Surgical History:  Procedure Laterality Date  . NO PAST SURGERIES    . none     Family History:  Family History  Problem Relation Age of Onset  . Hypertension Father    Family Psychiatric  History: His father and 2 brothers died of alcoholism  Social History: He has been married for over a year ago to a woman is known for several years. He was not forthcoming about his HIV status with her and it created a rift as she has learned about it from Dr. Sampson Goon when the patient was hospitalized for seizures in the fall.  He has been seizure-free for 6 months and is not allowed to return to work. 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 It is difficult for him to get a job given his criminal past. He has a son who is 44 years old and has 6 grandchildren. There are no current legal charges pending. History  Alcohol Use No     History  Drug Use  . Types: Marijuana    Comment: denies [id number]    Social History   Social History  . Marital status: Married    Spouse name: N/A  . Number of children: N/A  . Years of education: N/A   Social History Main Topics  . Smoking status: Current Every Day Smoker    Packs/day: 1.00    Types: Cigarettes  . Smokeless tobacco: Never Used  . Alcohol use No  . Drug use: Yes    Types: Marijuana     Comment: denies [id number]  . Sexual activity: Yes     Comment: has HIV   Other Topics Concern  . None   Social History Narrative  .  None   Additional Social History:    Pain Medications: marijuanna      Current Medications: Current Facility-Administered Medications  Medication Dose Route Frequency Provider Last Rate Last Dose  . acetaminophen (TYLENOL) tablet 650 mg  650 mg Oral Q6H PRN Beverly Sessions, MD      . alum & mag hydroxide-simeth (MAALOX/MYLANTA) 200-200-20 MG/5ML suspension 30 mL  30 mL Oral Q4H PRN Beverly Sessions, MD      . aspirin EC tablet 81 mg  81 mg Oral Daily Beverly Sessions, MD   81 mg at 07/27/16 4098  . baclofen (LIORESAL) tablet 10 mg  10 mg Oral QHS Jimmy Footman, MD      . carbamazepine (TEGRETOL) tablet 400 mg  400 mg Oral BID AC Beverly Sessions, MD   400 mg at 07/27/16 1191  . emtricitabine-tenofovir AF (DESCOVY) 200-25 MG per tablet 1 tablet  1 tablet Oral Daily Beverly Sessions, MD   1 tablet at 07/27/16 4782  . hydrOXYzine (ATARAX/VISTARIL) tablet 25 mg  25 mg Oral TID PRN Beverly Sessions, MD      . magnesium hydroxide (MILK OF MAGNESIA) suspension 30 mL  30 mL Oral Daily PRN Beverly Sessions, MD       . metoprolol succinate (TOPROL-XL) 24 hr tablet 50 mg  50 mg Oral Daily Beverly Sessions, MD   50 mg at 07/27/16 9562  . nicotine (NICODERM CQ - dosed in mg/24 hours) patch 21 mg  21 mg Transdermal Daily Jimmy Footman, MD   21 mg at 07/27/16 1308  . sertraline (ZOLOFT) tablet 25 mg  25 mg Oral Daily Jimmy Footman, MD   25 mg at 07/27/16 6578  . traZODone (DESYREL) tablet 50 mg  50 mg Oral QHS PRN Beverly Sessions, MD        Lab Results: No results found for this or any previous visit (from the past 48 hour(s)).  Blood Alcohol level:  Lab Results  Component Value Date   ETH <5 07/22/2016   ETH <5 07/19/2016    Metabolic Disorder Labs: Lab Results  Component Value Date   HGBA1C 5.3 07/21/2016   MPG 105 07/21/2016   No results found for: PROLACTIN Lab Results  Component Value Date   CHOL 161 07/21/2016   TRIG 213 (H) 07/21/2016   HDL 21 (L) 07/21/2016   CHOLHDL 7.7 07/21/2016   VLDL 43 (H) 07/21/2016   LDLCALC 97 07/21/2016    Physical Findings: AIMS: Facial and Oral Movements Muscles of Facial Expression: None, normal Lips and Perioral Area: None, normal Jaw: None, normal Tongue: None, normal,Extremity Movements Upper (arms, wrists, hands, fingers): None, normal Lower (legs, knees, ankles, toes): None, normal, Trunk Movements Neck, shoulders, hips: None, normal, Overall Severity Severity of abnormal movements (highest score from questions above): None, normal Incapacitation due to abnormal movements: None, normal Patient's awareness of abnormal movements (rate only patient's report): No Awareness, Dental Status Current problems with teeth and/or dentures?: No Does patient usually wear dentures?: No  CIWA:  CIWA-Ar Total: 0 COWS:     Musculoskeletal: Strength & Muscle Tone: within normal limits Gait & Station: normal Patient leans: N/A  Psychiatric Specialty Exam: Physical Exam  Constitutional: He is oriented to person, place, and  time. He appears well-developed and well-nourished.  HENT:  Head: Normocephalic and atraumatic.  Eyes: Conjunctivae and EOM are normal.  Respiratory: Effort normal.  Musculoskeletal: Normal range of motion.  Neurological: He is alert and oriented to person, place, and time.    Review of Systems  Neurological: Negative.   Endo/Heme/Allergies: Negative.   Psychiatric/Behavioral: Negative.  Negative for depression, hallucinations, memory loss, substance abuse and suicidal ideas. The patient is not nervous/anxious and does not have insomnia.   All other systems reviewed and are negative.   Blood pressure (!) 141/93, pulse (!) 54, temperature 97.8 F (36.6 C), temperature source Oral, resp. rate 18, height 6\' 3"  (1.905 m), weight (!) 149.7 kg (330 lb), SpO2 100 %.Body mass index is 41.25 kg/m.  General Appearance: Well Groomed  Eye Contact:  Good  Speech:  Clear and Coherent  Volume:  Normal  Mood:  Euthymic  Affect:  Appropriate and Congruent  Thought Process:  Linear and Descriptions of Associations: Intact  Orientation:  Full (Time, Place, and Person)  Thought Content:  Hallucinations: None  Suicidal Thoughts:  No  Homicidal Thoughts:  No  Memory:  Immediate;   Fair Recent;   Fair Remote;   Fair  Judgement:  Poor  Insight:  Shallow  Psychomotor Activity:  Normal  Concentration:  Concentration: Fair and Attention Span: Fair  Recall:  Good  Fund of Knowledge:  Fair  Language:  Good  Akathisia:  No  Handed:    AIMS (if indicated):     Assets:  Financial Resources/Insurance Housing  ADL's:  Intact  Cognition:  WNL  Sleep:  Number of Hours: 6.3     Treatment Plan Summary: Daily contact with patient to assess and evaluate symptoms and progress in treatment and Medication management   Patient with HIV and seizures. Admitted for the second time to our unit this month. The first hospitalization was secondary to 1 overdose in this hospitalization was triggered by him making  suicidal threats to family members.  Patient has very limited insight and is minimizing the events. Feels that he is ready to go and is requesting discharge.  Major depressive disorder:started the patient on sertraline 25 mg a day. He denies depressive symptoms however this is questionable as he appears to be minimizing in order to be discharged. Patient suffers from HIV, seizures and has marital trouble all which  significantly increase the risk for major depressive disorder.  Insomnia continue trazodone 50 mg by mouth daily at bedtime when necessary---slept 6h last night  Back pain: Continue baclofen 10 mg by mouth daily at bedtime  Seizures continue Tegretol 400 mg twice a day. I will check a Tegretol level in the morning  HIV continue antiviral treatment  Hypertension continue metoprolol 50 mg by mouth daily. Continue low sodium diet  Precautions every 15 minute checks  Diet low sodium  Collateral information will be obtained from family.  Possible discharge in the next 24 h  Patient seems to be improving. His mood is euthymic and he is affect appears to be bright and reactive. He has been actively participating in all groups. We will plan for discharge most likely tomorrow.  Jimmy Footman, MD 07/27/2016, 1:03 PM

## 2016-07-27 NOTE — Plan of Care (Signed)
Problem: Safety: Goal: Ability to remain free from injury will improve Outcome: Progressing Pt remains safe while in hospital injury free.    

## 2016-07-27 NOTE — [facility] Group Notes (Signed)
[facility] LCSW Group Therapy   07/27/2016 9:30 am   Type of Therapy: Group Therapy   Participation Level: Active   Participation Quality: Attentive, Sharing and Supportive   Affect: Appropriate   Cognitive: Alert and Oriented   Insight: Developing/Improving and Engaged   Engagement in Therapy: Developing/Improving and Engaged   Modes of Intervention: Clarification, Confrontation, Discussion, Education, Exploration, Limit-setting, Orientation, Problem-solving, Rapport Building, Dance movement psychotherapisteality Testing, Socialization and Support   Summary of Progress/Problems: The topic for group was balance in life. Today's group focused on defining balance in one's own words, identifying things that can knock one off balance, and exploring healthy ways to maintain balance in life. Group members were asked to provide an example of a time when they felt off balance, describe how they handled that situation, and process healthier ways to regain balance in the future. Group members were asked to share the most important tool for maintaining balance that [facility] and how they plan to apply this method after discharge. Pt defined what the word "balance" means to him. Pt was engaged throughout entire group and identified two out of balance areas such as spirituality and mental health. Pt also identified ways to work on out of balance areas in order to maintain balance.   Hampton AbbotKadijah Nikoletta Varma, MSW, LCSW-A 07/27/2016, 10:34AM

## 2016-07-27 NOTE — Plan of Care (Signed)
Problem: Activity: Goal: Sleeping patterns will improve Outcome: Progressing Patient slept for Estimated Hours of 6.30; Precautionary checks every 15 minutes for safety maintained, room free of safety hazards, patient sustains no injury or falls during this shift.    

## 2016-07-28 LAB — CARBAMAZEPINE LEVEL, TOTAL: CARBAMAZEPINE LVL: 9.4 ug/mL (ref 4.0–12.0)

## 2016-07-28 NOTE — Discharge Summary (Signed)
Physician Discharge Summary Note  Patient:  Victor Scott is an 46 y.o., male MRN:  [id number] DOB:  07/07/1970 Patient phone:  [phone] (home)  Patient address:   [address] [place] [address],  Total Time spent with patient: 30 minutes  Date of Admission:  07/22/2016 Date of Discharge: 07/28/16  Reason for Admission:  SI  Principal Problem: Severe recurrent major depression [facility](HCC) Discharge Diagnoses: Patient Active Problem List   Diagnosis Date Noted  . Severe recurrent major depression (HCC) [F33.2] 07/22/2016  . HTN (hypertension) [I10] 07/21/2016  . Cannabis use disorder, moderate, dependence (HCC) [F12.20] 07/21/2016  . Tobacco use disorder [F17.200] 07/21/2016  . HIV (human immunodeficiency virus infection) (HCC) [B20] 07/19/2016  . Seizures (HCC) [R56.9] 07/19/2016   History of Present Illness:   Identifying data. Mr. Victor Lewandowskyorain is a 46 year old male with no past psychiatric history.  Chief complaint. "My wife and I have some issues, I did the things I didn't mean to."  HPI- Pt interviewed, chart reviewed. Pt with adjustment disorder was discharged from this unit 2 days ago on 13th after 1 day stay. Pt presenting to the ED again [place] [facility]. [place]  was texting his ex girlfriend and , his wife  when the police came and picked him up. Per report, his wife told police he was texting her stating he was going to hang himself and had   Pt initially denied these allegations to other staff, now states he "didn't mean to hurt himself". States he should not be in the hospital, anxious and upset that he cannot leave and missing visit to his grandchildren.  He denies SI/HI and any auditory/visual hallucinations. Denies depression. Per report, pt had some alcohol during texting, denies drinking any alcohol to me, alcohol level <5.  UDS- positive for THC. Pt hoping to improve relationship with his wife, states he is  staying with his mom for last 2 months.   Per record,  For the past several months the patient has been experiencing marital problems and has been separated from his wife. On the day of admission he overdosed on metoprolol that is prescribed to him for hypertension but immediately called a friend and was brought to the hospital. He denies suicidal intention and explained that he was trying to get his wife's attention. Indeed she visited him in the emergency room. The patient realizes that he has been less than ideal husband, not wanting to talk to his wife about these problems, and withholding self-critical health information from her. During the past 2 months, he did a lot of soul searching, started reading the Bible, but connected with his church friends and decided that he would do what is necessary to reveal his marriage. He denies symptoms of depression, anxiety, psychosis or symptoms suggestive of bipolar disorder. This overdose was impulsive and did not have prior suicidal thoughts he smokes marijuana regularly but denies alcohol, illicit substance, or prescription pill abuse.  Past psychiatric history. [facility] d[facility] after 1 night stay for adjustment issues with his wife. No other psych  hospitalization, No h/o being treated for mental illness, or attempted suicide. He has past history of cocaine use but has been clean of heart substances for years. He has been in prison several times for a total of 21 years beginning at the age of 46. He has never been in mental health while incarcerated.  Family psychiatric history. His father and 2 brothers  died of alcoholism.  Social history. He has been married for over a year ago to a woman is known for several years. He was not forthcoming about his HIV status with her and it created a rift as she has learned about it from Dr. Sampson Scott when the patient was hospitalized for seizures in the fall. He has been seizure-free for 6 months and is  not allowed to return to work. 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 It is difficult for him to get a job given his criminal past. He has a son who is 20 years old and has 6 grandchildren. There are no current legal charges pending.  Past Medical History:  Past Medical History:  Diagnosis Date  . Depression   . HIV (human immunodeficiency virus infection) (HCC)   . Hypertension   . Seizures (HCC)     Past Surgical History:  Procedure Laterality Date  . NO PAST SURGERIES    . none     Family History:  Family History  Problem Relation Age of Onset  . Hypertension Father      Social History:  History  Alcohol Use No     History  Drug Use  . Types: Marijuana    Comment: denies [id number]    Social History   Social History  . Marital status: Married    Spouse name: N/A  . Number of children: N/A  . Years of education: N/A   Social History Main Topics  . Smoking status: Current Every Day Smoker    Packs/day: 1.00    Types: Cigarettes  . Smokeless tobacco: Never Used  . Alcohol use No  . Drug use: Yes    Types: Marijuana     Comment: denies [id number]  . Sexual activity: Yes     Comment: has HIV   Other Topics Concern  . None   Social History Narrative  . None    Hospital Course:    Patient with HIV and seizures. Admitted for the second time to our unit this month. The first hospitalization was secondary to 1 overdose in this hospitalization was triggered by him making suicidal threats to family members.  Patient has very limited insight and is minimizing the events. Feels that he is ready to go and is requesting discharge.  Major depressive disorder:started the patient on sertraline 50 mg a day. He denies depressive symptoms however this is questionable as he appears to be minimizing in order to be discharged. Patient suffers from HIV, seizures and has marital trouble all which  significantly increase the risk for major depressive  disorder.  Insomnia continue trazodone 50 mg by mouth daily at bedtime when necessary---slept 6h last night  Back pain: Continue baclofen 10 mg by mouth daily at bedtime  Seizures continue Tegretol 400 mg twice a day.  Ref. Range 07/28/2016 06:54  Carbamazepine (Tegretol), S Latest Ref Range: 4.0 - 12.0 ug/mL 9.4    HIV continue antiviral treatment  Hypertension continue metoprolol 50 mg by mouth daily. Continue low sodium diet  Patient reports he feels much improved. He is hopeful and future oriented. He will be returning to live with his mother but he has been able to clarify things with his wife. They had a good conversation yesterday. He denies any desire to harm himself or anybody else. He denies auditory or visual hallucinations. Denies side effects from medications or having any physical complaints. During his stay in the unit he was pleasant, calm and cooperative. He  participated actively in all groups. He comply with medications. He was very friendly.  During examination he is mood is euthymic and his affect bright and reactive. There is no evidence of psychosis, mania or hypomania.  He denies having any access to guns  Staff working with the patient reports patient is much improved. They feel he is a stable and ready for discharge. They do not voice any concern about his safety or the safety of others prior to discharge.   Physical Findings: AIMS: Facial and Oral Movements Muscles of Facial Expression: None, normal Lips and Perioral Area: None, normal Jaw: None, normal Tongue: None, normal,Extremity Movements Upper (arms, wrists, hands, fingers): None, normal Lower (legs, knees, ankles, toes): None, normal, Trunk Movements Neck, shoulders, hips: None, normal, Overall Severity Severity of abnormal movements (highest score from questions above): None, normal Incapacitation due to abnormal movements: None, normal Patient's awareness of abnormal movements (rate only  patient's report): No Awareness, Dental Status Current problems with teeth and/or dentures?: No Does patient usually wear dentures?: No  CIWA:  CIWA-Ar Total: 0 COWS:     Musculoskeletal: Strength & Muscle Tone: within normal limits Gait & Station: normal Patient leans: N/A  Psychiatric Specialty Exam: Physical Exam  Constitutional: He is oriented to person, place, and time. He appears well-developed and well-nourished.  HENT:  Head: Normocephalic and atraumatic.  Eyes: Conjunctivae and EOM are normal.  Neck: Normal range of motion.  Respiratory: Effort normal.  Musculoskeletal: Normal range of motion.  Neurological: He is alert and oriented to person, place, and time.    Review of Systems  Constitutional: Negative.   HENT: Negative.   Eyes: Negative.   Respiratory: Negative.   Cardiovascular: Negative.   Gastrointestinal: Negative.   Genitourinary: Negative.   Musculoskeletal: Negative.   Skin: Negative.   Neurological: Negative.   Endo/Heme/Allergies: Negative.   Psychiatric/Behavioral: Negative.     Blood pressure (!) 150/92, pulse (!) 55, temperature 97.8 F (36.6 C), temperature source Oral, resp. rate 19, height 6\' 3"  (1.905 m), weight (!) 149.7 kg (330 lb), SpO2 99 %.Body mass index is 41.25 kg/m.  General Appearance: Well Groomed  Eye Contact:  Good  Speech:  Clear and Coherent  Volume:  Normal  Mood:  Euthymic  Affect:  Appropriate and Congruent  Thought Process:  Linear and Descriptions of Associations: Intact  Orientation:  Full (Time, Place, and Person)  Thought Content:  Hallucinations: None  Suicidal Thoughts:  No  Homicidal Thoughts:  No  Memory:  Immediate;   Good Recent;   Good Remote;   Good  Judgement:  Fair  Insight:  Fair  Psychomotor Activity:  Normal  Concentration:  Concentration: Good and Attention Span: Good  Recall:  Good  Fund of Knowledge:  Good  Language:  Good  Akathisia:  No  Handed:    AIMS (if indicated):     Assets:   Communication Skills Physical Health  ADL's:  Intact  Cognition:  WNL  Sleep:  Number of Hours: 6.3     Have you used any form of tobacco in the last 30 days? (Cigarettes, Smokeless Tobacco, Cigars, and/or Pipes): Yes  Has this patient used any form of tobacco in the last 30 days? (Cigarettes, Smokeless Tobacco, Cigars, and/or Pipes) Yes, Yes, A prescription for an FDA-approved tobacco cessation medication was offered at discharge and the patient refused  Blood Alcohol level:  Lab Results  Component Value Date   [facility] <5 07/22/2016   ETH <5 07/19/2016    Metabolic  Disorder Labs:  Lab Results  Component Value Date   HGBA1C 5.3 07/21/2016   MPG 105 07/21/2016   No results found for: PROLACTIN Lab Results  Component Value Date   CHOL 161 07/21/2016   TRIG 213 (H) 07/21/2016   HDL 21 (L) 07/21/2016   CHOLHDL 7.7 07/21/2016   VLDL 43 (H) 07/21/2016   LDLCALC 97 07/21/2016    See Psychiatric Specialty Exam and Suicide Risk Assessment completed by Attending Physician prior to discharge.  Discharge destination:  Home  Is patient on multiple antipsychotic therapies at discharge:  No   Has Patient had three or more failed trials of antipsychotic monotherapy by history:  No  Recommended Plan for Multiple Antipsychotic Therapies: NA   Allergies as of 07/28/2016      Reactions   Fish Allergy       Medication List    STOP taking these medications   meloxicam 15 MG tablet Commonly known as:  MOBIC   PREZCOBIX 800-150 MG tablet Generic drug:  darunavir-cobicistat     TAKE these medications     Indication  aspirin EC 81 MG tablet Take 81 mg by mouth daily.  Indication:  Inflammation   baclofen 10 MG tablet Commonly known as:  LIORESAL Take 1 tablet (10 mg total) by mouth at bedtime as needed for muscle spasms. What changed:  when to take this  reasons to take this  Indication:  Muscle Spasticity   carbamazepine 200 MG tablet Commonly known as:  TEGRETOL Take  2 tablets (400 mg total) by mouth 2 (two) times daily before a meal.  Indication:  Tonic-Clonic Seizures   emtricitabine-tenofovir AF 200-25 MG tablet Commonly known as:  DESCOVY Take 1 tablet by mouth daily.  Indication:  HIV Disease   metoprolol succinate 50 MG 24 hr tablet Commonly known as:  TOPROL-XL Take 1 tablet (50 mg total) by mouth daily.  Indication:  High Blood Pressure Disorder   sertraline 50 MG tablet Commonly known as:  ZOLOFT Take 1 tablet (50 mg total) by mouth daily.  Indication:  depression      Follow-up Information    [facility] Follow up on 07/31/2016.   Why:  Unk Pinto of [facility] will pick you up on Monday, 07/31/16, at 7:00am and transport you to your intake appointment.  Please bring a copy of your hospital discharge paperwork. Contact information: [address] [place] [address] [phone]          >30 minutes. >50 % of the time was spent in coordination of care  Signed: Jimmy Footman, MD 07/28/2016, 10:01 AM

## 2016-07-28 NOTE — Progress Notes (Signed)
D: Pt denies SI/HI/AVH, affect is flat and sad, denies pain or discomfort. Pt is pleasant and cooperative, he appears less anxious and he is interacting with peers and staff appropriately.  A: Pt was offered support and encouragement. Pt was given scheduled medications. Pt was encouraged to attend groups. Q 15 minute checks were done for safety.  R:Pt attends groups and interacts well with peers and staff. Pt is taking medication. Pt has no complaints.Pt receptive to treatment and safety maintained on unit.

## 2016-07-28 NOTE — Progress Notes (Signed)
Patient alert and oriented x 4. Denies current SI/HI, A/V/H. Discharged home to his relatives house. No complaints of pain nor discomfort noted. Personal items, medications and Rx given to patient upon discharge. Patient verbalized understanding the after visit summary and other discharge information. Patient received a bus pass for transport home, courtesy ride called for patient for ride to bus stop.

## 2016-07-28 NOTE — [facility] Group Notes (Signed)
[facility] LCSW Group Therapy Note  Date/Time: 07/28/16, 0930  Type of Therapy and Topic:  Group Therapy:  Feelings around Relapse and Recovery  Participation Level:  Active   Mood:pleasant  Description of Group:    Patients in this group will discuss emotions they experience before and after a relapse. They will process how experiencing these feelings, or avoidance of experiencing them, relates to having a relapse. Facilitator will guide patients to explore emotions they have related to recovery. Patients will be encouraged to process which emotions are more powerful. They will be guided to discuss the emotional reaction significant others in their lives may have to patients' relapse or recovery. Patients will be assisted in exploring ways to respond to the emotions of others without this contributing to a relapse.  Therapeutic Goals: 1. Patient will identify two or more emotions that lead to relapse for them:  2. Patient will identify two emotions that result when they relapse:  3. Patient will identify two emotions related to recovery:  4. Patient will demonstrate ability to communicate their needs through discussion and/or role plays.   Summary of Patient Progress: Pt very active in group today.  Identified lonliness and rejection as emotions that can lead to relapse for him.  Pt made a number of contributions to group discussion about how thoughts and feelings can lead to relapse and how other supports to deal with difficult thoughts and feelings can avoid relapse.     Therapeutic Modalities:   Cognitive Behavioral Therapy Solution-Focused Therapy Assertiveness Training Relapse Prevention Therapy  Daleen SquibbGreg Emerick Weatherly, LCSW

## 2016-07-28 NOTE — [facility] Group Notes (Signed)
[facility] Group Notes:  (Nursing/MHT/Case Management/Adjunct)  Date:  07/28/2016  Time:  12:35 AM  Type of Therapy:  Psychoeducational Skills  Participation Level:  Active  Participation Quality:  Sharing  Affect:  Excited  Cognitive:  Oriented  Insight:  Lacking  Engagement in Group:  Lacking, Limited, Monopolizing and Off Topic  Modes of Intervention:  Clarification and Discussion  Summary of Progress/Problems:  Victor Scott [name] 07/28/2016, 12:35 AM

## 2016-07-28 NOTE — Progress Notes (Signed)
  [facility] :  Will you be returning to the same living situation after discharge:  Yes,  with mother At discharge, do you have transportation home?: No. Bus pass provided. Do you have the ability to pay for your medications: No.Medication management clinic referral made.  Release of information consent forms completed and in the chart;  Patient's signature needed at discharge.  Patient to Follow up at: Follow-up Information    [facility] Follow up on 07/31/2016.   Why:  Unk PintoHarvey Bryant of [facility] will pick you up on Monday, 07/31/16, at 7:00am and transport you to your intake appointment.  Please bring a copy of your hospital discharge paperwork. Contact information: [address] [place] [address] [phone]           Next level of care provider has access to [facility] Link:no  Safety Planning and Suicide Prevention discussed: No. Pt refused. SPE completed with pt.  Have you used any form of tobacco in the last 30 days? (Cigarettes, Smokeless Tobacco, Cigars, and/or Pipes): Yes  Has patient been referred to the Quitline?: Patient refused referral  Patient has been referred for addiction treatment: Yes  Victor FrederickWierda, Victor Kyler Jon, LCSW 07/28/2016, 12:50 PM

## 2016-07-28 NOTE — [facility] Suicide Risk Assessment (Signed)
[facility]   Principal Problem: Severe recurrent major depression [facility](HCC) Discharge Diagnoses:  Patient Active Problem List   Diagnosis Date Noted  . Severe recurrent major depression (HCC) [F33.2] 07/22/2016  . HTN (hypertension) [I10] 07/21/2016  . Cannabis use disorder, moderate, dependence (HCC) [F12.20] 07/21/2016  . Tobacco use disorder [F17.200] 07/21/2016  . HIV (human immunodeficiency virus infection) (HCC) [B20] 07/19/2016  . Seizures (HCC) [R56.9] 07/19/2016     Psychiatric Specialty Exam: ROS  Blood pressure (!) 150/92, pulse (!) 55, temperature 97.8 F (36.6 C), temperature source Oral, resp. rate 19, height 6\' 3"  (1.905 m), weight (!) 149.7 kg (330 lb), SpO2 99 %.Body mass index is 41.25 kg/m.                                                       Mental Status Per Nursing Assessment::   On Admission:  NA  Demographic Factors:  Male, Low socioeconomic status and Unemployed  Loss Factors: Decline in physical health and Financial problems/change in socioeconomic status  Historical Factors: Impulsivity  Risk Reduction Factors:   Sense of responsibility to family, Living with another person, especially a relative and Positive social support Denies access to guns  Continued Clinical Symptoms:  Depression:   Impulsivity Previous Psychiatric Diagnoses and Treatments Medical Diagnoses and Treatments/Surgeries  Cognitive Features That Contribute To Risk:  Closed-mindedness    Suicide Risk:  Minimal: No identifiable suicidal ideation.  Patients presenting with no risk factors but with morbid ruminations; may be classified as minimal risk based on the severity of the depressive symptoms  Follow-up Information    [facility] Follow up on 07/31/2016.   Why:  Unk PintoHarvey Bryant of [facility] will pick you up on Monday, 07/31/16, at 7:00am and transport you to your intake appointment.  Please  bring a copy of your hospital discharge paperwork. Contact information: [address] [place] [address] [phone]            Jimmy FootmanHernandez-Gonzalez,  Ruel Dimmick, MD 07/28/2016, 9:56 AM

## 2016-11-29 ENCOUNTER — Emergency Department: Payer: Self-pay

## 2016-11-29 ENCOUNTER — Other Ambulatory Visit: Payer: Self-pay

## 2016-11-29 ENCOUNTER — Encounter: Payer: Self-pay | Admitting: Emergency Medicine

## 2016-11-29 ENCOUNTER — Emergency Department
Admission: EM | Admit: 2016-11-29 | Discharge: 2016-11-30 | Disposition: A | Discharge status: Home / Self Care | Payer: Self-pay | Attending: Emergency Medicine | Admitting: Emergency Medicine

## 2016-11-29 DIAGNOSIS — S8392XA Sprain of unspecified site of left knee, initial encounter: Secondary | ICD-10-CM | POA: Insufficient documentation

## 2016-11-29 DIAGNOSIS — Y939 Activity, unspecified: Secondary | ICD-10-CM | POA: Insufficient documentation

## 2016-11-29 DIAGNOSIS — Z7982 Long term (current) use of aspirin: Secondary | ICD-10-CM | POA: Insufficient documentation

## 2016-11-29 DIAGNOSIS — W109XXA Fall (on) (from) unspecified stairs and steps, initial encounter: Secondary | ICD-10-CM | POA: Insufficient documentation

## 2016-11-29 DIAGNOSIS — Z79899 Other long term (current) drug therapy: Secondary | ICD-10-CM | POA: Insufficient documentation

## 2016-11-29 DIAGNOSIS — F17210 Nicotine dependence, cigarettes, uncomplicated: Secondary | ICD-10-CM | POA: Insufficient documentation

## 2016-11-29 DIAGNOSIS — Y999 Unspecified external cause status: Secondary | ICD-10-CM | POA: Insufficient documentation

## 2016-11-29 DIAGNOSIS — Z91013 Allergy to seafood: Secondary | ICD-10-CM | POA: Insufficient documentation

## 2016-11-29 DIAGNOSIS — I10 Essential (primary) hypertension: Secondary | ICD-10-CM | POA: Insufficient documentation

## 2016-11-29 DIAGNOSIS — B20 Human immunodeficiency virus [HIV] disease: Secondary | ICD-10-CM | POA: Insufficient documentation

## 2016-11-29 DIAGNOSIS — Y929 Unspecified place or not applicable: Secondary | ICD-10-CM | POA: Insufficient documentation

## 2016-11-29 NOTE — ED Triage Notes (Signed)
Pt to triage via w/c with no distress noted; pt c/o "twisting" left knee after falling down steps tonight ; denies any other injuries or c/o

## 2016-11-30 NOTE — ED Provider Notes (Signed)
[facility] Emergency Department Provider Note  ____________________________________________   First MD Initiated Contact with Patient 11/29/16 2349     (approximate)  I have reviewed the triage vital signs and the nursing notes.   HISTORY  Chief Complaint Knee Pain    HPI Victor BeltsBartholomew Scott is a 46 y.o. male who presents for evaluation of acute onset left knee pain and swelling.  He reports that prior to arrival he was walking down some stairs and slipped on the hardwood and twisted his knee when he fell.  He did not strike his head and has no headache, loss of consciousness, nor neck pain.  He is able to bear weight but it is painful for him to do so.  He always has some mild chronic knee pain but he has some swelling in the left knee that is abnormal for him.  The pain is moderate to severe when he tries to walk but mild at rest.  He has no other symptoms at this time.  Past Medical History:  Diagnosis Date  . Depression   . HIV (human immunodeficiency virus infection) (HCC)   . Hypertension   . Seizures [facility](HCC)     Patient Active Problem List   Diagnosis Date Noted  . Severe recurrent major depression (HCC) 07/22/2016  . HTN (hypertension) 07/21/2016  . Cannabis use disorder, moderate, dependence (HCC) 07/21/2016  . Tobacco use disorder 07/21/2016  . HIV (human immunodeficiency virus infection) (HCC) 07/19/2016  . Seizures (HCC) 07/19/2016    Past Surgical History:  Procedure Laterality Date  . NO PAST SURGERIES    . none      Prior to Admission medications   Medication Sig Start Date End Date Taking? Authorizing Provider  aspirin EC 81 MG tablet Take 81 mg by mouth daily.    [provider]  baclofen (LIORESAL) 10 MG tablet Take 1 tablet (10 mg total) by mouth at bedtime as needed for muscle spasms. 07/27/16   Jimmy FootmanHernandez-Gonzalez, Andrea, MD  carbamazepine (TEGRETOL) 200 MG tablet Take 2 tablets (400 mg total) by mouth 2 (two) times  daily before a meal. 07/21/16   Pucilowska, Jolanta B, MD  emtricitabine-tenofovir AF (DESCOVY) 200-25 MG tablet Take 1 tablet by mouth daily.    [provider]  metoprolol succinate (TOPROL-XL) 50 MG 24 hr tablet Take 1 tablet (50 mg total) by mouth daily. 04/27/16   Sharman CheekStafford, Phillip, MD  sertraline (ZOLOFT) 50 MG tablet Take 1 tablet (50 mg total) by mouth daily. 07/28/16   Jimmy FootmanHernandez-Gonzalez, Andrea, MD    Allergies Fish allergy  Family History  Problem Relation Age of Onset  . Hypertension Father     Social History Social History   Tobacco Use  . Smoking status: Current Every Day Smoker    Packs/day: 1.00    Types: Cigarettes  . Smokeless tobacco: Never Used  Substance Use Topics  . Alcohol use: No  . Drug use: Yes    Types: Marijuana    Comment: denies [id number]    Review of Systems Constitutional: No fever/chills Cardiovascular: Denies chest pain. Respiratory: Denies shortness of breath. Gastrointestinal: No abdominal pain.  No nausea, no vomiting.   Musculoskeletal: Acute onset pain in his left knee with some swelling after slipping on the stairs . negative for neck pain.  Negative for back pain. Neurological: Negative for headaches, focal weakness or numbness.   ____________________________________________   PHYSICAL EXAM:  VITAL SIGNS: ED Triage Vitals  Enc Vitals Group  BP 11/29/16 2208 (!) 146/78     Pulse Rate 11/29/16 2208 82     Resp 11/29/16 2208 20     Temp 11/29/16 2208 98 F (36.7 C)     Temp Source 11/29/16 2208 Oral     SpO2 11/29/16 2208 97 %     Weight 11/29/16 2207 (!) 149.7 kg (330 lb)     Height 11/29/16 2207 1.88 m (6\' 2" )     Head Circumference --      Peak Flow --      Pain Score 11/29/16 2207 10     Pain Loc --      Pain Edu? --      Excl. in GC? --     Constitutional: Alert and oriented. Well appearing and in no acute distress. Head: Atraumatic. Cardiovascular: Normal rate, regular rhythm. Good peripheral  circulation.  Respiratory: Normal respiratory effort.  No retractions. Lungs CTAB. Musculoskeletal: Mild to moderate effusion all around the left knee with some tenderness to manipulation, mild tenderness with flexion extension of the knee, no joint or ligamentous laxity.  No other gross deformities Neurologic:  Normal speech and language. No gross focal neurologic deficits are appreciated.  Skin:  Skin is warm, dry and intact. No rash noted. Psychiatric: Mood and affect are normal. Speech and behavior are normal.  ____________________________________________   LABS (all labs ordered are listed, but only abnormal results are displayed)  Labs Reviewed - No data to display ____________________________________________  EKG  None - EKG not ordered by ED physician ____________________________________________  RADIOLOGY   Dg Knee Complete 4 Views Left  Result Date: 11/29/2016 CLINICAL DATA:  Fall with left knee pain EXAM: LEFT KNEE - COMPLETE 4+ VIEW COMPARISON:  None. FINDINGS: There is no acute fracture or dislocation of the left knee. There is heterotopic calcification within the quadriceps tendon. The joint spaces are preserved. There is medial and lateral osteophytosis of the femorotibial compartments, more prominent medially. Patellofemoral joint space is preserved. IMPRESSION: 1. No acute fracture or dislocation the left knee. 2. 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 Electronically Signed   By: Deatra RobinsonKevin  Herman M.D.   On: 11/29/2016 22:36    ____________________________________________   PROCEDURES  Critical Care performed: No   Procedure(s) performed:   Procedures   ____________________________________________   INITIAL IMPRESSION / ASSESSMENT AND PLAN / ED COURSE  As part of my medical decision making, I reviewed the following data within the electronic MEDICAL RECORD NUMBER Nursing notes reviewed and incorporated and  Radiograph reviewed     Probable soft tissue injury/sprain of the left knee.  Differential diagnosis includes internal derangement of the knee including meniscus injury or extensive ligamentous damage, but at this point based on his physical exam I doubt that the injury is more severe than a sprain.  No indication for knee immobilizer. Ace wrap, RICE, crutches for comfort, ortho follow up.  Patient agrees with plan.     ____________________________________________  FINAL CLINICAL IMPRESSION(S) / ED DIAGNOSES  Final diagnoses:  Sprain of left knee, unspecified ligament, initial encounter     MEDICATIONS GIVEN DURING THIS VISIT:  Medications - No data to display   ED Discharge Orders    None       Note:  This document was prepared using Dragon voice recognition software and may include unintentional dictation errors.    Loleta RoseForbach, Davis Ambrosini, MD 11/30/16 0110

## 2016-11-30 NOTE — Discharge Instructions (Signed)
Your workup was reassuring today that you do not have a bony injury or dislocation to your knee.  We recommend that you use the provided Ace wrap and crutches as needed, but you can continue to bear weight as tolerated.  Read through the included information about routine care for injuries.  Use over-the-counter Tylenol and/or ibuprofen as neeed.  Follow up as recommended if you are still having problems in about a week.

## 2017-03-01 ENCOUNTER — Other Ambulatory Visit: Payer: Self-pay

## 2017-03-01 ENCOUNTER — Emergency Department: Payer: Self-pay

## 2017-03-01 ENCOUNTER — Encounter: Payer: Self-pay | Admitting: Emergency Medicine

## 2017-03-01 ENCOUNTER — Emergency Department
Admission: EM | Admit: 2017-03-01 | Discharge: 2017-03-01 | Disposition: A | Discharge status: Home / Self Care | Payer: Self-pay | Attending: Emergency Medicine | Admitting: Emergency Medicine

## 2017-03-01 DIAGNOSIS — I10 Essential (primary) hypertension: Secondary | ICD-10-CM | POA: Insufficient documentation

## 2017-03-01 DIAGNOSIS — R030 Elevated blood-pressure reading, without diagnosis of hypertension: Secondary | ICD-10-CM

## 2017-03-01 DIAGNOSIS — Z79899 Other long term (current) drug therapy: Secondary | ICD-10-CM | POA: Insufficient documentation

## 2017-03-01 DIAGNOSIS — M778 Other enthesopathies, not elsewhere classified: Secondary | ICD-10-CM | POA: Insufficient documentation

## 2017-03-01 DIAGNOSIS — Z7982 Long term (current) use of aspirin: Secondary | ICD-10-CM | POA: Insufficient documentation

## 2017-03-01 DIAGNOSIS — F1210 Cannabis abuse, uncomplicated: Secondary | ICD-10-CM | POA: Insufficient documentation

## 2017-03-01 DIAGNOSIS — F17210 Nicotine dependence, cigarettes, uncomplicated: Secondary | ICD-10-CM | POA: Insufficient documentation

## 2017-03-01 DIAGNOSIS — B20 Human immunodeficiency virus [HIV] disease: Secondary | ICD-10-CM | POA: Insufficient documentation

## 2017-03-01 DIAGNOSIS — M779 Enthesopathy, unspecified: Secondary | ICD-10-CM

## 2017-03-01 MED ORDER — PREDNISONE 10 MG PO TABS: ORAL_TABLET | ORAL | 0 refills | Status: AC

## 2017-03-01 NOTE — ED Triage Notes (Signed)
Pt states he was seen here for a fall a few months ago, states increasing pain in left hand and arm, worsens with movement, states ibuprofen has not been helping.

## 2017-03-01 NOTE — ED Provider Notes (Signed)
[facility] Emergency Department Provider Note  ___________________________________________   First MD Initiated Contact with Patient 03/01/17 1243     (approximate)  I have reviewed the triage vital signs and the nursing notes.   HISTORY  Chief Complaint Hand Pain and Arm Pain  HPI Victor Scott is a 47 y.o. male is here complaining of left hand pain.  Patient states that he was seen several months ago for a fall in which he injured his knee.  He states that his hand was not x-rayed but does not recall a specific injury to his hand.  He states that his hand began hurting 2 weeks ago when he thought that it might be related to his fall.  Patient has increased pain when moving his thumb.  He has taken over-the-counter ibuprofen infrequently and in small amounts without any relief.  He rates his pain as an 8 out of 10.  Past Medical History:  Diagnosis Date  . Depression   . HIV (human immunodeficiency virus infection) (HCC)   . Hypertension   . Seizures [facility](HCC)     Patient Active Problem List   Diagnosis Date Noted  . Severe recurrent major depression (HCC) 07/22/2016  . HTN (hypertension) 07/21/2016  . Cannabis use disorder, moderate, dependence (HCC) 07/21/2016  . Tobacco use disorder 07/21/2016  . HIV (human immunodeficiency virus infection) (HCC) 07/19/2016  . Seizures (HCC) 07/19/2016    Past Surgical History:  Procedure Laterality Date  . NO PAST SURGERIES    . none      Prior to Admission medications   Medication Sig Start Date End Date Taking? Authorizing Provider  aspirin EC 81 MG tablet Take 81 mg by mouth daily.    [provider]  baclofen (LIORESAL) 10 MG tablet Take 1 tablet (10 mg total) by mouth at bedtime as needed for muscle spasms. 07/27/16   Jimmy FootmanHernandez-Gonzalez, Andrea, MD  carbamazepine (TEGRETOL) 200 MG tablet Take 2 tablets (400 mg total) by mouth 2 (two) times daily before a meal. 07/21/16   Pucilowska, Jolanta B,  MD  emtricitabine-tenofovir AF (DESCOVY) 200-25 MG tablet Take 1 tablet by mouth daily.    [provider]  metoprolol succinate (TOPROL-XL) 50 MG 24 hr tablet Take 1 tablet (50 mg total) by mouth daily. 04/27/16   Sharman CheekStafford, Phillip, MD  predniSONE (DELTASONE) 10 MG tablet Take 6 tablets  today, on day 2 take 5 tablets, day 3 take 4 tablets, day 4 take 3 tablets, day 5 take  2 tablets and 1 tablet the last day 03/01/17   Tommi RumpsSummers, Keelee Yankey L, PA-C  sertraline (ZOLOFT) 50 MG tablet Take 1 tablet (50 mg total) by mouth daily. 07/28/16   Jimmy FootmanHernandez-Gonzalez, Andrea, MD    Allergies Fish allergy  Family History  Problem Relation Age of Onset  . Hypertension Father     Social History Social History   Tobacco Use  . Smoking status: Current Every Day Smoker    Packs/day: 1.00    Types: Cigarettes  . Smokeless tobacco: Never Used  Substance Use Topics  . Alcohol use: No  . Drug use: Yes    Types: Marijuana    Comment: denies [id number]    Review of Systems Constitutional: No fever/chills Cardiovascular: Denies chest pain. Respiratory: Denies shortness of breath. Musculoskeletal: Positive for left hand pain. Skin: Negative for rash. Neurological: Negative for headaches, focal weakness or numbness. ___________________________________________   PHYSICAL EXAM:  VITAL SIGNS: ED Triage Vitals  Enc Vitals Group  BP 03/01/17 1151 (!) 159/102     Pulse Rate 03/01/17 1151 76     Resp 03/01/17 1151 18     Temp 03/01/17 1151 97.7 F (36.5 C)     Temp Source 03/01/17 1151 Oral     SpO2 03/01/17 1151 96 %     Weight 03/01/17 1151 300 lb (136.1 kg)     Height 03/01/17 1151 6\' 1"  (1.854 m)     Head Circumference --      Peak Flow --      Pain Score 03/01/17 1201 8     Pain Loc --      Pain Edu? --      Excl. in GC? --    Constitutional: Alert and oriented. Well appearing and in no acute distress. Eyes: Conjunctivae are normal.  Head: Atraumatic. Neck: No stridor.     Cardiovascular: Normal rate, regular rhythm. Grossly normal heart sounds.  Good peripheral circulation. Respiratory: Normal respiratory effort.  No retractions. Lungs CTAB. Musculoskeletal: On examination of the left hand there is no gross deformity however there is soft tissue swelling to the left thumb.  Range of motion is slightly restricted secondary to discomfort and pain is increased with extension with resistance.  Skin is intact.  No erythema to suggest gout.  There is tenderness on palpation of soft tissues but DIP joint is nontender. Neurologic:  Normal speech and language. No gross focal neurologic deficits are appreciated.  Skin:  Skin is warm, dry and intact.  No erythema, abrasions or ecchymosis noted. Psychiatric: Mood and affect are normal. Speech and behavior are normal.  ____________________________________________   LABS (all labs ordered are listed, but only abnormal results are displayed)  Labs Reviewed - No data to display  RADIOLOGY  ED MD interpretation:   Left hand x-ray is negative for acute bony injury.  Official radiology report(s): Dg Hand Complete Left  Result Date: 03/01/2017 CLINICAL DATA:  Left hand pain for 1 week without known injury. EXAM: LEFT HAND - COMPLETE 3+ VIEW COMPARISON:  None. FINDINGS: There is no evidence of fracture or dislocation. There is no evidence of arthropathy. Congenitally short fourth metacarpal is noted. Soft tissues are unremarkable. IMPRESSION: No acute abnormality seen in the left hand. Electronically Signed   By: Lupita Raider, M.D.   On: 03/01/2017 13:31    ____________________________________________   PROCEDURES  Procedure(s) performed: None  Procedures  Critical Care performed: No  ____________________________________________   INITIAL IMPRESSION / ASSESSMENT AND PLAN / ED COURSE  As part of my medical decision making, I reviewed the following data within the electronic MEDICAL RECORD NUMBER Notes from prior ED  visits and [place] Controlled Substance Database  Patient was made aware that x-ray was negative.  In assessing this patient it appears that this is more a tendinitis.  Patient was placed in a thumb spica splint that was prefabricated.  He was also placed on prednisone tapering dose.  He is to follow-up with his PCP for reevaluation and also for recheck of his blood pressure which was elevated in the ED.  ____________________________________________   FINAL CLINICAL IMPRESSION(S) / ED DIAGNOSES  Final diagnoses:  Tendinitis of thumb  Elevated blood pressure reading     ED Discharge Orders        Ordered    predniSONE (DELTASONE) 10 MG tablet     03/01/17 1339       Note:  This document was prepared using Dragon voice recognition software and may include unintentional  dictation errors.    Tommi Rumps, PA-C 03/01/17 1512    Jene Every, MD 03/01/17 [phone]

## 2017-03-01 NOTE — Discharge Instructions (Signed)
Begin taking prednisone as directed starting with 6 tablets today and tapering down by 1 each day.  Wear thumb spica splint for support and protection.  Follow-up with your primary care provider Dr. Greggory StallionGeorge to have your blood pressure rechecked as it was elevated in the emergency department today. 159/102 and 146/98

## 2017-03-01 NOTE — ED Notes (Signed)
See triage note  States he developed pain to left thumb and wrist area about 1-2 weeks ago  Min swelling noted  Tenderness noted to wrist and forearm

## 2018-12-28 IMAGING — CT CT HEAD W/O CM
3 series · 15 of 47 positions shown, 18 images · non-contrast
Comparison: CT of the head performed 10/19/2015, and MRI of the
brain performed 10/20/2015

CLINICAL DATA: Acute onset of altered mental status and confusion.
Personal history of seizures. Initial encounter.

EXAM:
CT HEAD WITHOUT CONTRAST
TECHNIQUE: Contiguous axial images were obtained from the base of the skull
through the vertex without intravenous contrast.

[Series 2: head wo · axial · 0.47mm/px · z∈[+525,+660]mm · 9 of 33 slices shown, 12 images]
[im 3/33  brain]
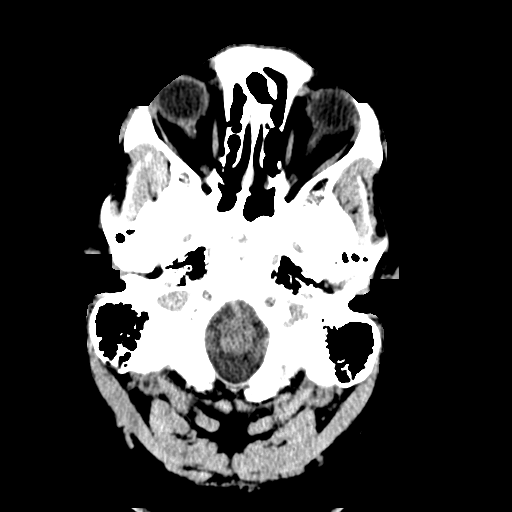
[im 3/33  bone]
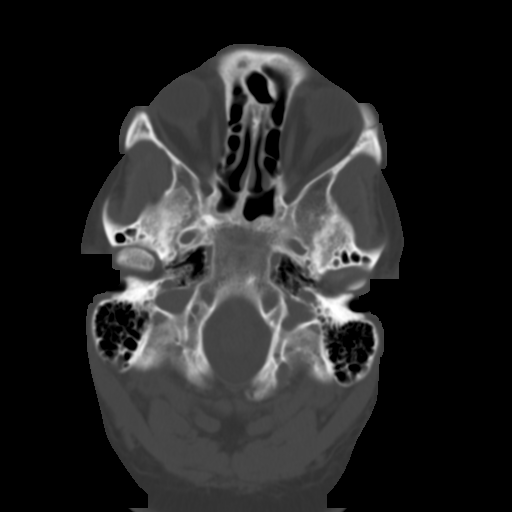
[im 6/33  brain]
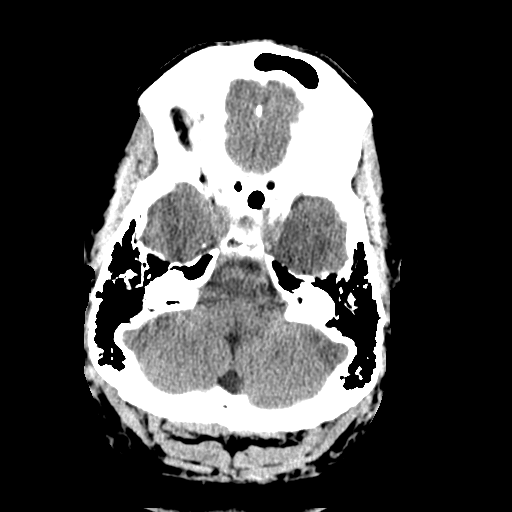
[im 9/33  brain]
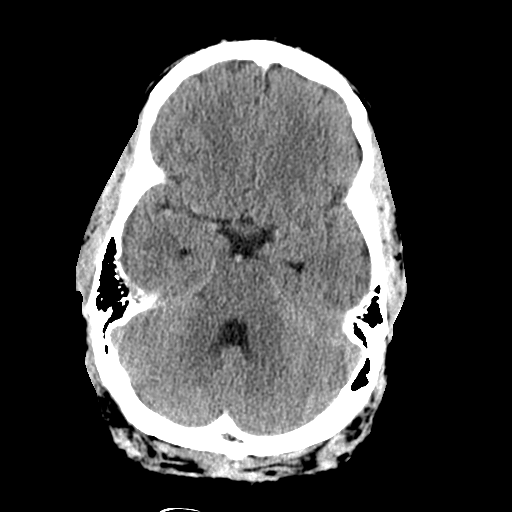
[im 13/33  brain]
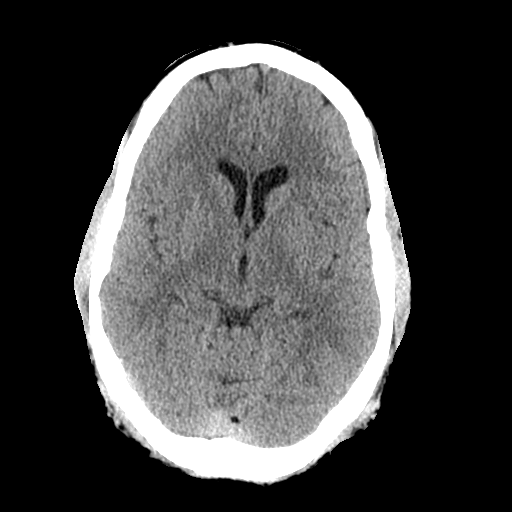
[im 17/33  brain]
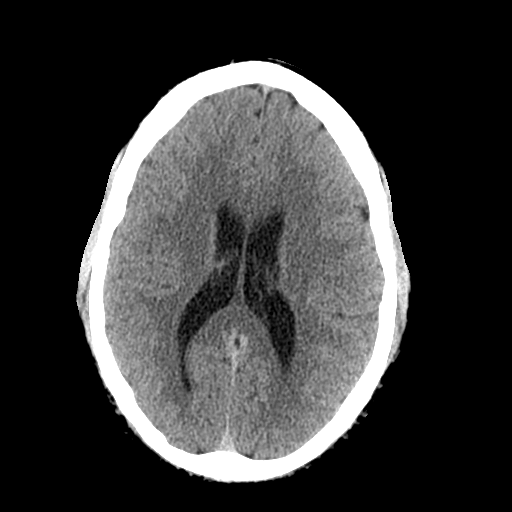
[im 17/33  bone]
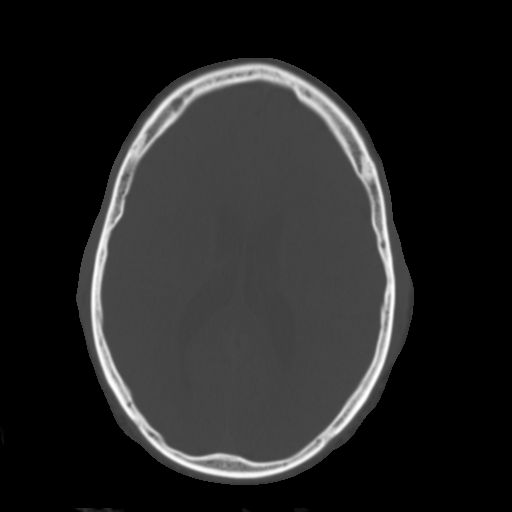
[im 20/33  brain]
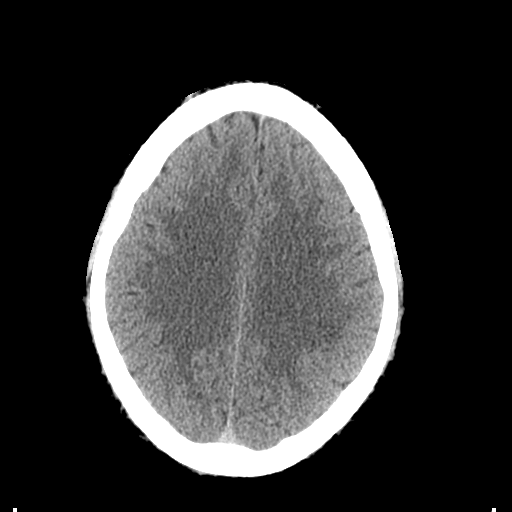
[im 24/33  brain]
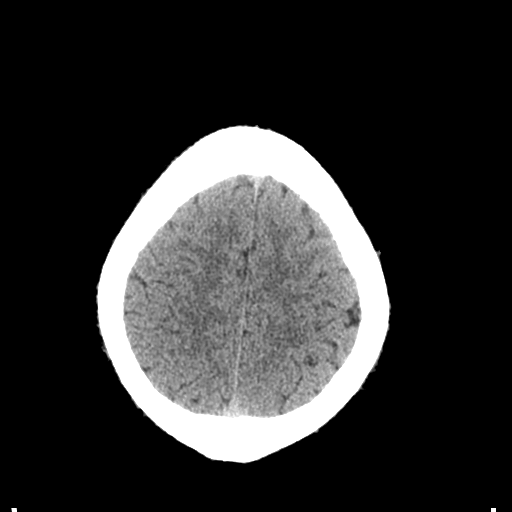
[im 27/33  brain]
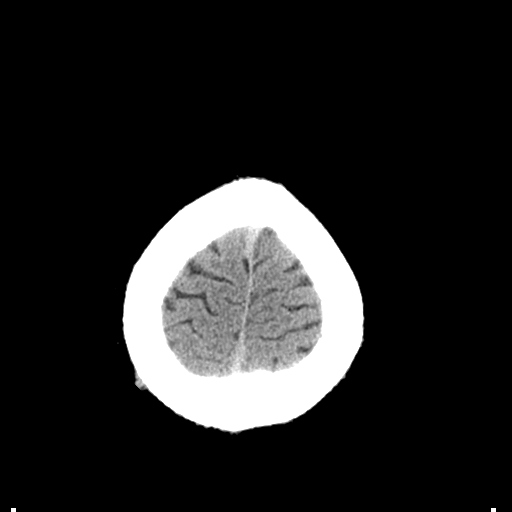
[im 30/33  brain]
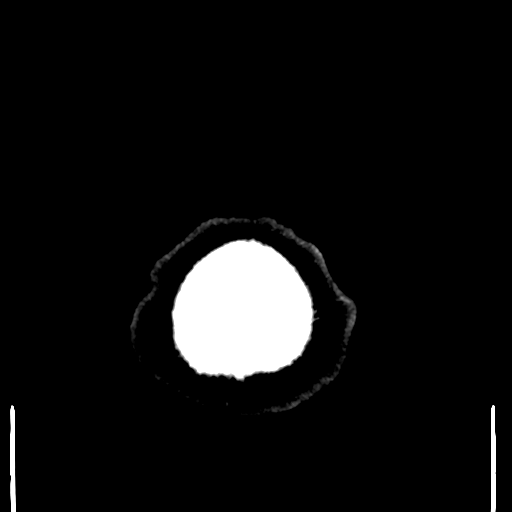
[im 30/33  bone]
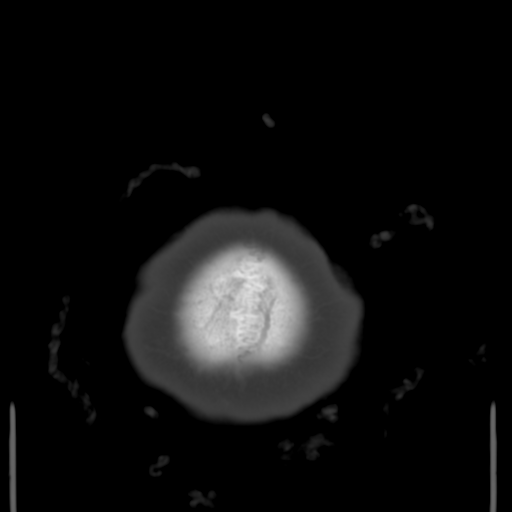

[Series 4: coronal soft tissue · coronal · 0.33mm/px · 3 of 70 slices shown]
[im 24/70  brain]
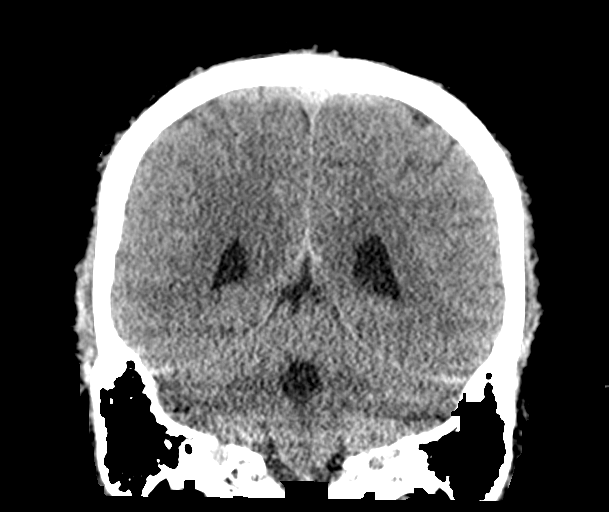
[im 31/70  brain]
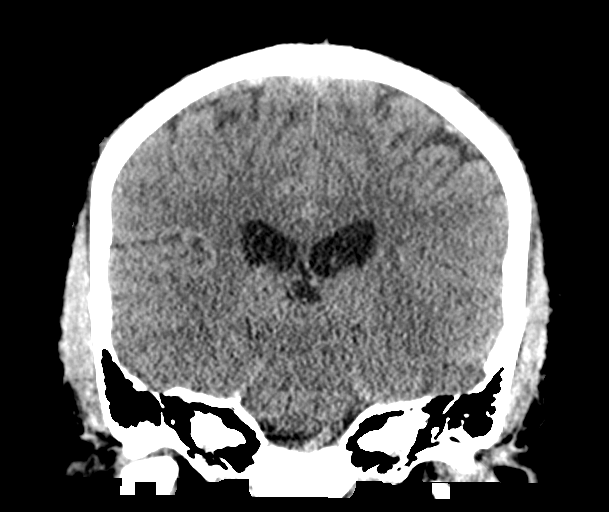
[im 39/70  brain]
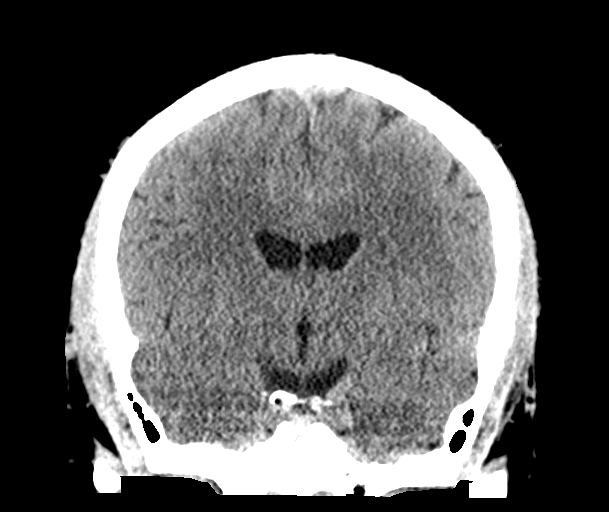

[Series 5: sagittal soft tissue · sagittal · 0.33mm/px · 3 of 57 slices shown]
[im 19/57  brain]
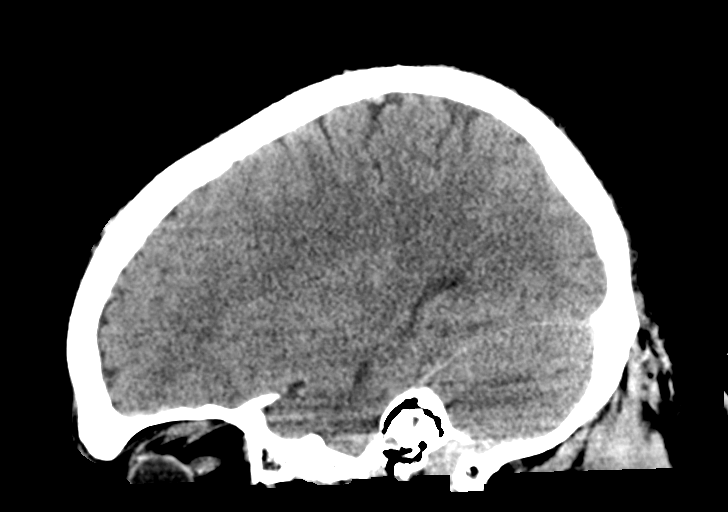
[im 29/57  brain]
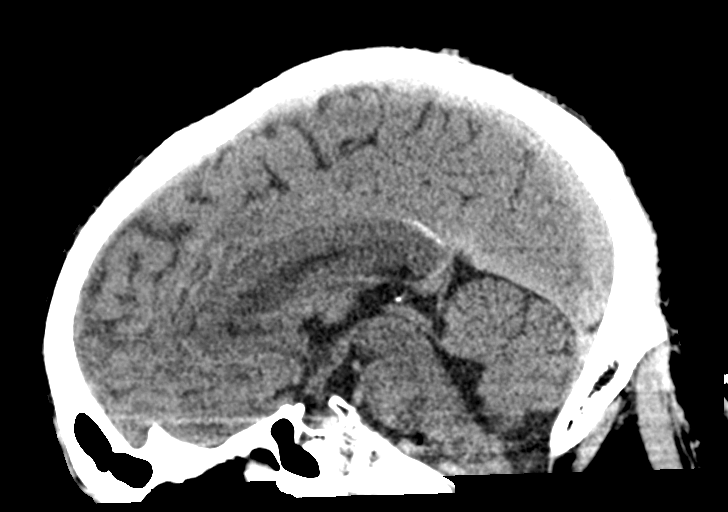
[im 38/57  brain]
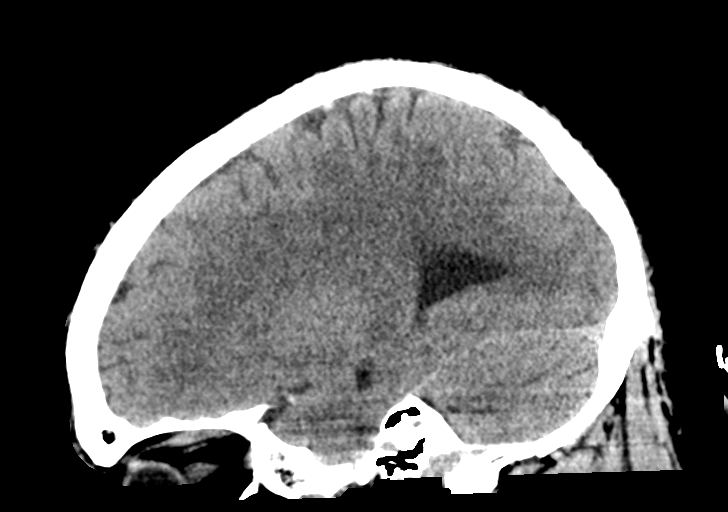

[15 of 47 positions shown; findings below may reference images not displayed]

FINDINGS: Brain: No evidence of acute infarction, hemorrhage, hydrocephalus,
extra-axial collection or mass lesion/mass effect.

The posterior fossa, including the cerebellum, brainstem and fourth
ventricle, is within normal limits. The third and lateral
ventricles, and basal ganglia are unremarkable in appearance. The
cerebral hemispheres are symmetric in appearance, with normal
gray-white differentiation. No mass effect or midline shift is seen.

Vascular: No hyperdense vessel or unexpected calcification.

Skull: There is no evidence of fracture; visualized osseous
structures are unremarkable in appearance.

Sinuses/Orbits: The visualized portions of the orbits are within
normal limits. The paranasal sinuses and mastoid air cells are
well-aerated.

Other: No significant soft tissue abnormalities are seen.
IMPRESSION: Unremarkable noncontrast CT of the head.

## 2019-11-01 IMAGING — DX DG HAND COMPLETE 3+V*L*
3 series · 3 of 3 positions shown · non-contrast
Comparison: None.

CLINICAL DATA: Left hand pain for 1 week without known injury.

EXAM:
LEFT HAND - COMPLETE 3+ VIEW

[hand ap]
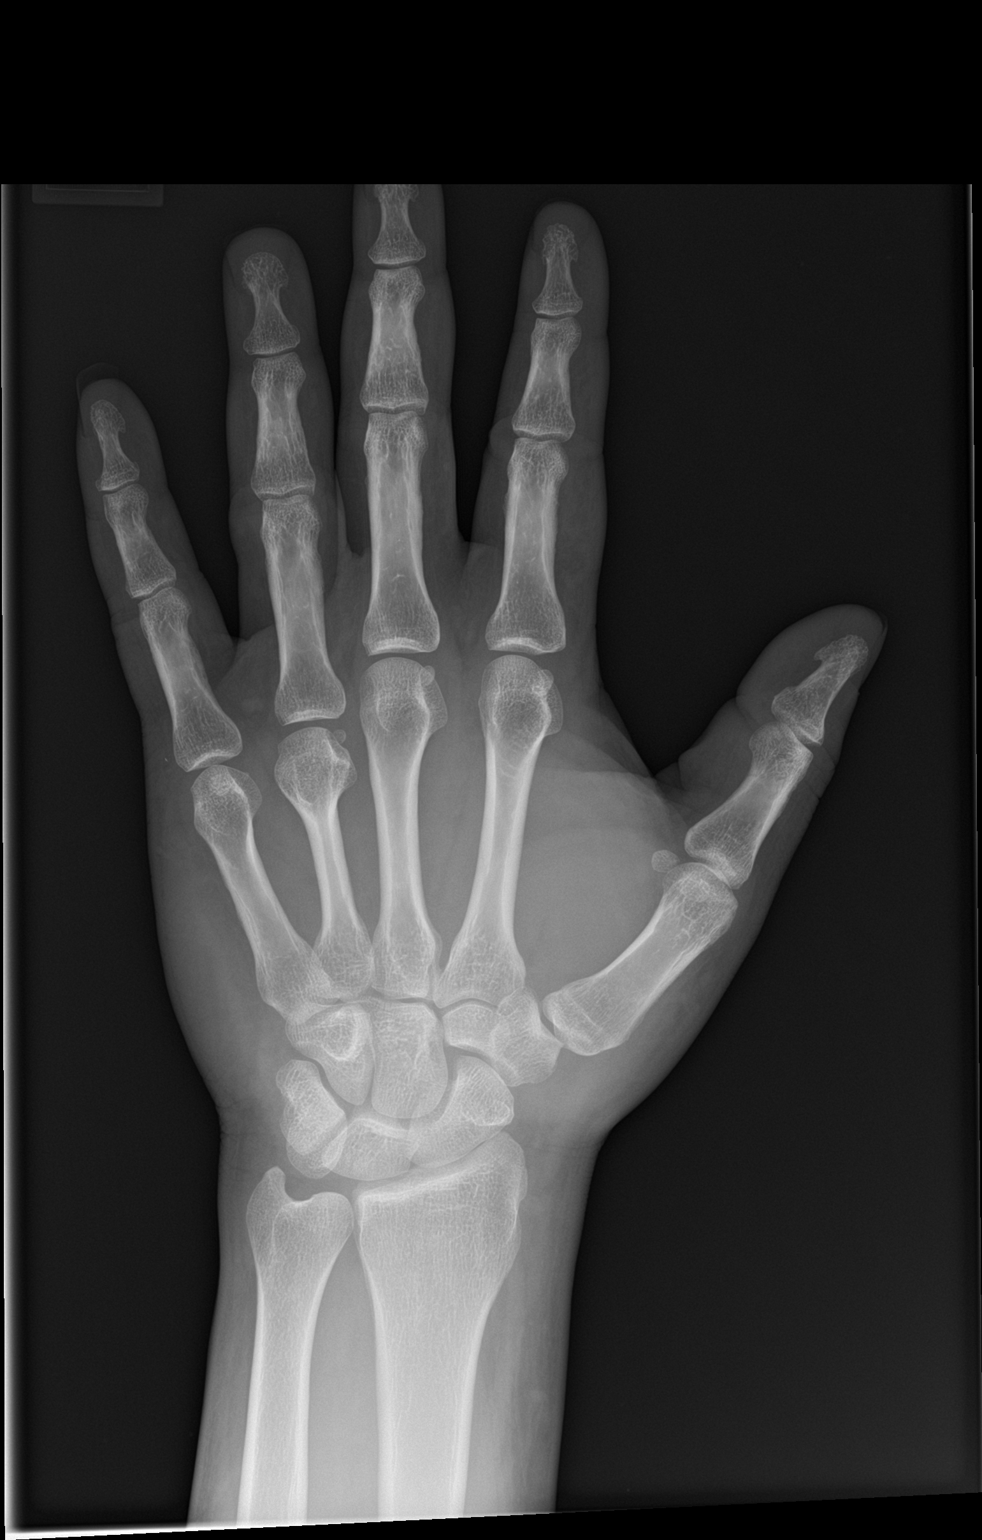

[hand obl]
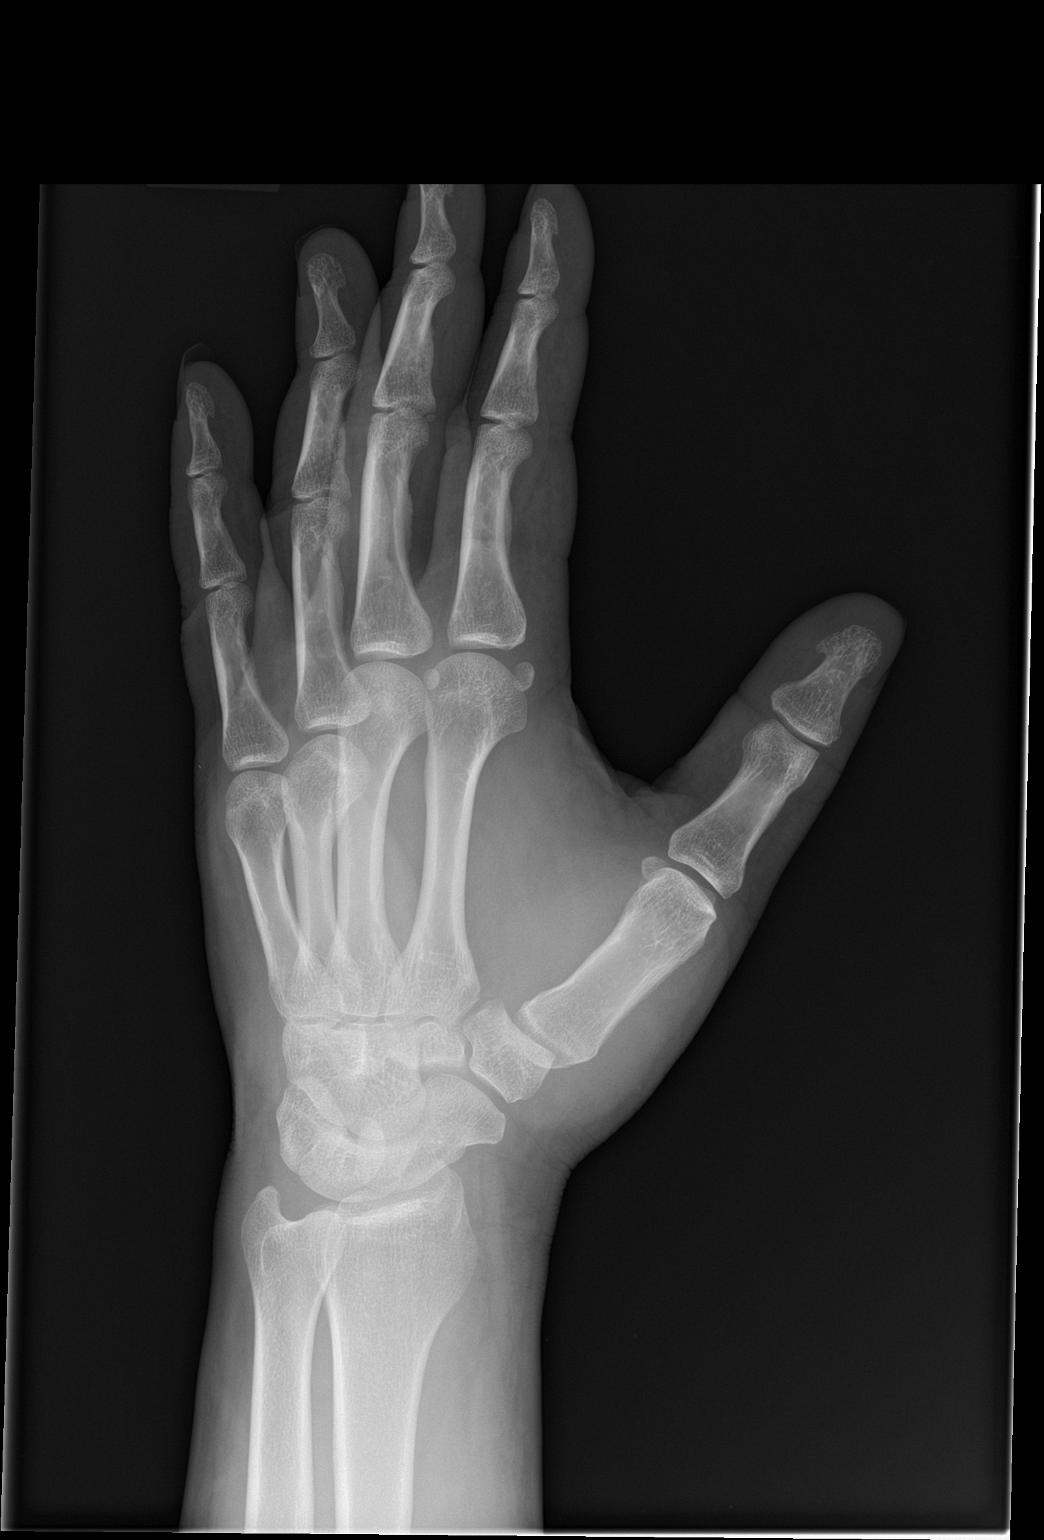

[hand lat]
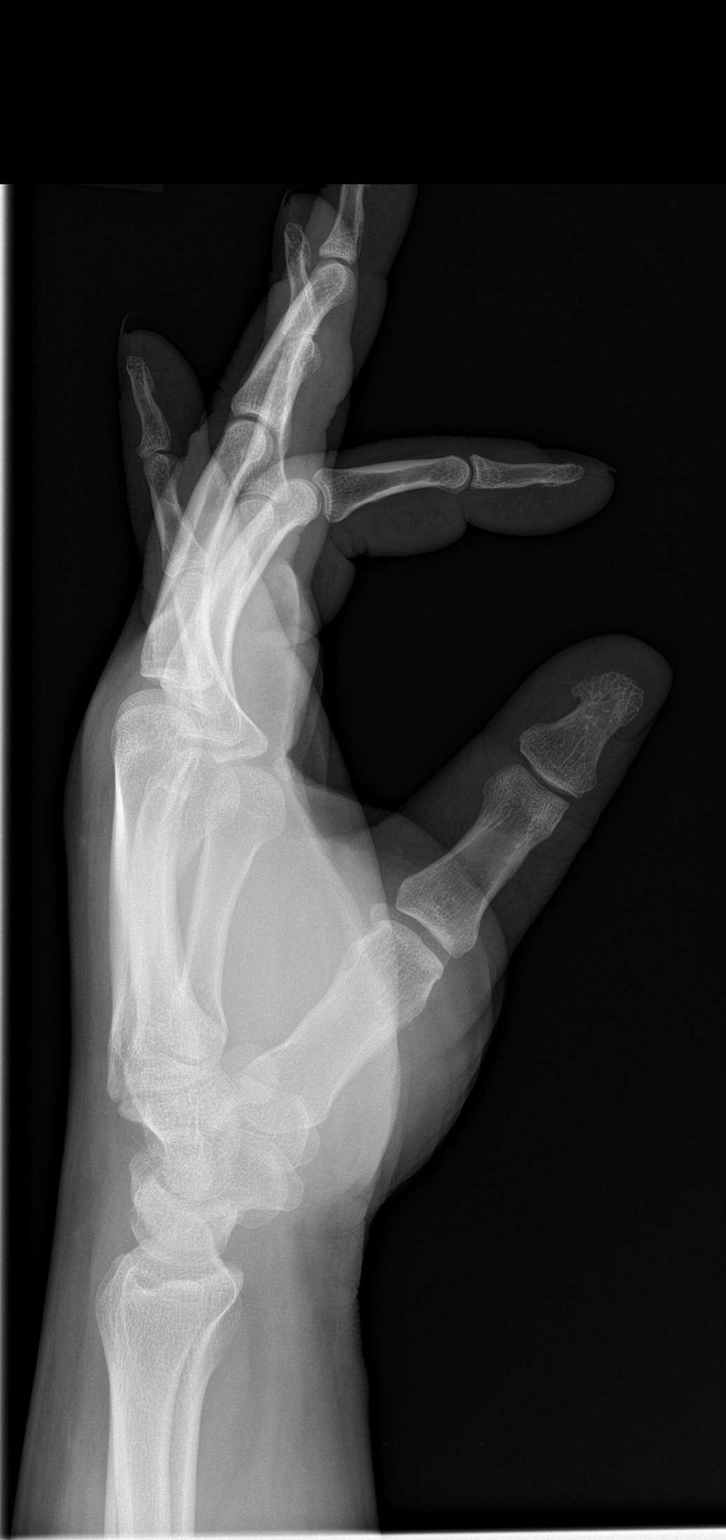

[3 of 3 positions shown; findings below may reference images not displayed]

FINDINGS: There is no evidence of fracture or dislocation. There is no
evidence of arthropathy. Congenitally short fourth metacarpal is
noted. Soft tissues are unremarkable.
IMPRESSION: No acute abnormality seen in the left hand.

## 2022-05-17 ENCOUNTER — Emergency Department
Admission: EM | Admit: 2022-05-17 | Discharge: 2022-05-18 | Disposition: A | Discharge status: Home / Self Care | Payer: Medicaid Other | Attending: Emergency Medicine | Admitting: Emergency Medicine

## 2022-05-17 ENCOUNTER — Encounter: Payer: Self-pay | Admitting: Emergency Medicine

## 2022-05-17 ENCOUNTER — Other Ambulatory Visit: Payer: Self-pay

## 2022-05-17 DIAGNOSIS — R739 Hyperglycemia, unspecified: Secondary | ICD-10-CM | POA: Diagnosis present

## 2022-05-17 DIAGNOSIS — Z21 Asymptomatic human immunodeficiency virus [HIV] infection status: Secondary | ICD-10-CM | POA: Insufficient documentation

## 2022-05-17 DIAGNOSIS — I10 Essential (primary) hypertension: Secondary | ICD-10-CM | POA: Diagnosis not present

## 2022-05-17 DIAGNOSIS — E1165 Type 2 diabetes mellitus with hyperglycemia: Secondary | ICD-10-CM | POA: Insufficient documentation

## 2022-05-17 LAB — BASIC METABOLIC PANEL
Anion gap: 12 (ref 5–15)
BUN: 13 mg/dL (ref 6–20)
CO2: 20 mmol/L — ABNORMAL LOW (ref 22–32)
Calcium: 9.2 mg/dL (ref 8.9–10.3)
Chloride: 94 mmol/L — ABNORMAL LOW (ref 98–111)
Creatinine, Ser: 1.01 mg/dL (ref 0.61–1.24)
GFR, Estimated: >60 mL/min (ref >60)
Glucose, Bld: 577 mg/dL (ref 70–99)
Potassium: 4.1 mmol/L (ref 3.5–5.1)
Sodium: 126 mmol/L — ABNORMAL LOW (ref 135–145)

## 2022-05-17 LAB — CBC
HCT: 46.8 % (ref 39.0–52.0)
Hemoglobin: 14.7 g/dL (ref 13.0–17.0)
MCH: 23.5 pg — ABNORMAL LOW (ref 26.0–34.0)
MCHC: 31.4 g/dL (ref 30.0–36.0)
MCV: 74.8 fL — ABNORMAL LOW (ref 80.0–100.0)
Platelets: 233 10*3/uL (ref 150–400)
RBC: 6.26 MIL/uL — ABNORMAL HIGH (ref 4.22–5.81)
RDW: 14.3 % (ref 11.5–15.5)
WBC: 6.1 10*3/uL (ref 4.0–10.5)
nRBC: 0 % (ref 0.0–0.2)

## 2022-05-17 LAB — CBG MONITORING, ED: Glucose-Capillary: 585 mg/dL (ref 70–99)

## 2022-05-17 MED ORDER — SODIUM CHLORIDE 0.9 % IV BOLUS
1000.0000 mL | Freq: Once | INTRAVENOUS | Status: AC
  Administered 2022-05-17: 1000 mL via INTRAVENOUS

## 2022-05-17 MED ORDER — INSULIN ASPART 100 UNIT/ML IJ SOLN
10.0000 [IU] | Freq: Once | INTRAMUSCULAR | Status: AC
  Administered 2022-05-17: 10 [IU] via INTRAVENOUS
  Filled 2022-05-17: qty 1

## 2022-05-17 NOTE — ED Provider Notes (Signed)
[facility] Provider Note    Event Date/Time   First MD Initiated Contact with Patient 05/17/22 2304     (approximate)  History   Chief Complaint: Hyperglycemia  HPI  Victor Scott is a 52 y.o. male with a past medical history of HIV, hypertension, diabetes who presents to the emergency department for hyperglycemia.  According to the patient for the past few days or longer his blood sugar has been elevated sometimes reading in the high level.  Patient went to a community clinic today and the blood sugar read "high" so they sent the patient to the emergency department for evaluation.  Patient denies any symptoms.  No fever cough congestion vomiting diarrhea no chest pain or abdominal pain.  Physical Exam   Triage Vital Signs: ED Triage Vitals  Enc Vitals Group     BP 05/17/22 1749 123/86     Pulse Rate 05/17/22 1749 82     Resp 05/17/22 1749 18     Temp 05/17/22 1749 98.5 F (36.9 C)     Temp Source 05/17/22 1749 Oral     SpO2 05/17/22 1749 98 %     Weight 05/17/22 1750 233 lb (105.7 kg)     Height 05/17/22 1750 6\' 2"  (1.88 m)     Head Circumference --      Peak Flow --      Pain Score 05/17/22 1755 0     Pain Loc --      Pain Edu? --      Excl. in GC? --     Most recent vital signs: Vitals:   05/17/22 1749 05/17/22 2324  BP: 123/86 126/86  Pulse: 82 80  Resp: 18 18  Temp: 98.5 F (36.9 C) 98.4 F (36.9 C)  SpO2: 98% 98%    General: Awake, no distress.  CV:  Good peripheral perfusion.  Regular rate and rhythm  Resp:  Normal effort.  Equal breath sounds bilaterally.  Abd:  No distention.  Soft, nontender.  No rebound or guarding.  ED Results / Procedures / Treatments   MEDICATIONS ORDERED IN ED: Medications  insulin aspart (novoLOG) injection 10 Units (10 Units Intravenous Given 05/17/22 2314)  sodium chloride 0.9 % bolus 1,000 mL (1,000 mLs Intravenous New Bag/Given 05/17/22 2311)     IMPRESSION / MDM / ASSESSMENT AND PLAN / ED  COURSE  I reviewed the triage vital signs and the nursing notes.  Patient's presentation is most consistent with acute presentation with potential threat to life or bodily function.  Patient presents emergency department for hyperglycemia.  Patient states his blood sugars have been running high for at least the past few days, but he states realistically they have been running high for years.  Patient states he has been taking his Lantus and NovoLog at home.  States his doctor recently ordered him Ozempic but he has been unable to get this medication filled as of yet.  Patient has no other complaints or concerns tonight.  Patient's lab work does show blood glucose of 577 with pseudohyponatremia at 126.  Patient CBC is reassuring.  Anion gap of 12.  No concern for DKA.  Will place an IV dose IV insulin, continue IV hydration and closely monitor.  Patient agreeable to plan of care.  Patient's blood sugar is down to 330.  Has received 2 L of fluid and is feeling well.  Patient states he is ready to go home.  I discussed with the patient to resume his  normal insulin today 5/9 starting this morning.  Return to the emergency department for any significant hyperglycemia any nausea vomiting or any other symptom concerning to the patient otherwise he is to follow-up with his doctor.  FINAL CLINICAL IMPRESSION(S) / ED DIAGNOSES   Hyperglycemia    Note:  This document was prepared using Dragon voice recognition software and may include unintentional dictation errors.   Minna Antis, MD 05/18/22 [phone]

## 2022-05-17 NOTE — ED Notes (Signed)
Critical result: glucose 577  McHugh, MD made aware.

## 2022-05-17 NOTE — ED Triage Notes (Signed)
Pt to ED via POV for hyperglycemia. Pt states that he was seen at [facility] center today and that his blood sugar was so high they could not get a reading. According to the paperwork sent with the pt, his last 3 A1C readings have been over 15.5%.

## 2022-05-18 LAB — CBG MONITORING, ED
Glucose-Capillary: 331 mg/dL — ABNORMAL HIGH (ref 70–99)
Glucose-Capillary: 339 mg/dL — ABNORMAL HIGH (ref 70–99)
Glucose-Capillary: 404 mg/dL — ABNORMAL HIGH (ref 70–99)

## 2022-05-18 MED ORDER — INSULIN ASPART 100 UNIT/ML IJ SOLN
4.0000 [IU] | Freq: Once | INTRAMUSCULAR | Status: AC
  Administered 2022-05-18: 4 [IU] via INTRAVENOUS
  Filled 2022-05-18: qty 1

## 2022-05-18 MED ORDER — SODIUM CHLORIDE 0.9 % IV BOLUS
1000.0000 mL | Freq: Once | INTRAVENOUS | Status: AC
  Administered 2022-05-18: 1000 mL via INTRAVENOUS

## 2022-05-18 NOTE — Discharge Instructions (Addendum)
Please resume your normal insulin regimen this morning.  Please follow-up with your doctor within the next 1 to 2 days for recheck/reevaluation as well as to discuss your diabetic regiment.  Return to the emergency department for any significant high blood sugar any nausea vomiting or any other symptom concerning to yourself.

## 2023-06-05 ENCOUNTER — Emergency Department
Admission: EM | Admit: 2023-06-05 | Discharge: 2023-06-05 | Disposition: A | Discharge status: Home / Self Care | Payer: Self-pay | Attending: Emergency Medicine | Admitting: Emergency Medicine

## 2023-06-05 ENCOUNTER — Other Ambulatory Visit: Payer: Self-pay

## 2023-06-05 DIAGNOSIS — Z21 Asymptomatic human immunodeficiency virus [HIV] infection status: Secondary | ICD-10-CM | POA: Insufficient documentation

## 2023-06-05 DIAGNOSIS — A630 Anogenital (venereal) warts: Secondary | ICD-10-CM | POA: Insufficient documentation

## 2023-06-05 LAB — CBC WITH DIFFERENTIAL/PLATELET
Abs Immature Granulocytes: 0.04 10*3/uL (ref 0.00–0.07)
Basophils Absolute: 0 10*3/uL (ref 0.0–0.1)
Basophils Relative: 1 %
Eosinophils Absolute: 0.2 10*3/uL (ref 0.0–0.5)
Eosinophils Relative: 3 %
HCT: 41.6 % (ref 39.0–52.0)
Hemoglobin: 13 g/dL (ref 13.0–17.0)
Immature Granulocytes: 1 %
Lymphocytes Relative: 30 %
Lymphs Abs: 1.9 10*3/uL (ref 0.7–4.0)
MCH: 22.5 pg — ABNORMAL LOW (ref 26.0–34.0)
MCHC: 31.3 g/dL (ref 30.0–36.0)
MCV: 71.8 fL — ABNORMAL LOW (ref 80.0–100.0)
Monocytes Absolute: 0.4 10*3/uL (ref 0.1–1.0)
Monocytes Relative: 7 %
Neutro Abs: 3.6 10*3/uL (ref 1.7–7.7)
Neutrophils Relative %: 58 %
Platelets: 309 10*3/uL (ref 150–400)
RBC: 5.79 MIL/uL (ref 4.22–5.81)
RDW: 18.6 % — ABNORMAL HIGH (ref 11.5–15.5)
WBC: 6.2 10*3/uL (ref 4.0–10.5)
nRBC: 0 % (ref 0.0–0.2)

## 2023-06-05 LAB — BASIC METABOLIC PANEL WITH GFR
Anion gap: 7 (ref 5–15)
BUN: 15 mg/dL (ref 6–20)
CO2: 24 mmol/L (ref 22–32)
Calcium: 8.7 mg/dL — ABNORMAL LOW (ref 8.9–10.3)
Chloride: 109 mmol/L (ref 98–111)
Creatinine, Ser: 1.08 mg/dL (ref 0.61–1.24)
GFR, Estimated: >60 mL/min (ref >60)
Glucose, Bld: 121 mg/dL — ABNORMAL HIGH (ref 70–99)
Potassium: 3.7 mmol/L (ref 3.5–5.1)
Sodium: 140 mmol/L (ref 135–145)

## 2023-06-05 LAB — HIV ANTIBODY (ROUTINE TESTING W REFLEX): HIV Screen 4th Generation wRfx: REACTIVE — AB

## 2023-06-05 MED ORDER — IMIQUIMOD 5 % EX CREA: TOPICAL_CREAM | Freq: Every day | CUTANEOUS | 1 refills | Status: AC

## 2023-06-05 NOTE — ED Provider Notes (Signed)
 [facility] Provider Note    Event Date/Time   First MD Initiated Contact with Patient 06/05/23 1209     (approximate)   History   Rash   HPI Victor Scott is a 53 y.o. male with history of HIV presenting today for rash.  Patient states over the past month he has had a rash on his buttocks.  He has pain and itchiness around the area.  Denies abdominal pain, fever, nausea, vomiting, diarrhea, constipation, dysuria.  He had previously been on Cabenuva for his HIV but had increasing viral loads so he was switched to zidovudine.  The rash popped up prior to switching medications.  He states being on a topical steroid cream by his doctor which has not changed symptoms.  Does not remember his last CD4 count.  States his viral load had gone higher than the thousand before switching medications but is now back under thousand.  Reports last episode of anal intercourse was 10 years ago.      Physical Exam   Triage Vital Signs: ED Triage Vitals  Encounter Vitals Group     BP 06/05/23 1201 134/88     Systolic BP Percentile --      Diastolic BP Percentile --      Pulse Rate 06/05/23 1201 98     Resp 06/05/23 1201 16     Temp 06/05/23 1201 97.7 F (36.5 C)     Temp Source 06/05/23 1201 Oral     SpO2 06/05/23 1201 99 %     Weight 06/05/23 1202 280 lb (127 kg)     Height 06/05/23 1202 6\' 1"  (1.854 m)     Head Circumference --      Peak Flow --      Pain Score 06/05/23 1201 5     Pain Loc --      Pain Education --      Exclude from Growth Chart --     Most recent vital signs: Vitals:   06/05/23 1201  BP: 134/88  Pulse: 98  Resp: 16  Temp: 97.7 F (36.5 C)  SpO2: 99%   I have reviewed the vital signs. General:  Awake, alert, no acute distress. Head:  Normocephalic, Atraumatic. EENT:  PERRL, EOMI, Oral mucosa pink and moist, Neck is supple. Cardiovascular: Regular rate, 2+ distal pulses. Respiratory:  Normal respiratory effort, symmetrical  expansion, no distress.   Extremities:  Moving all four extremities through full ROM without pain.   Neuro:  Alert and oriented.  Interacting appropriately.   Skin: Wartlike rash around the rectal region with a cauliflower nature to it but no active bleeding or other surrounding erythema.  See media tab. Psych: Appropriate affect.    ED Results / Procedures / Treatments   Labs (all labs ordered are listed, but only abnormal results are displayed) Labs Reviewed  CBC WITH DIFFERENTIAL/PLATELET - Abnormal; Notable for the following components:      Result Value   MCV 71.8 (*)    MCH 22.5 (*)    RDW 18.6 (*)    All other components within normal limits  BASIC METABOLIC PANEL WITH GFR - Abnormal; Notable for the following components:   Glucose, Bld 121 (*)    Calcium 8.7 (*)    All other components within normal limits  HELPER T-LYMPH-CD4 ([facility] ONLY)  HIV ANTIBODY (ROUTINE TESTING W REFLEX)  RPR     EKG    RADIOLOGY    PROCEDURES:  Critical Care performed: No  Procedures   MEDICATIONS ORDERED IN ED: Medications - No data to display   IMPRESSION / MDM / ASSESSMENT AND PLAN / ED COURSE  I reviewed the triage vital signs and the nursing notes.                              Differential diagnosis includes, but is not limited to, HPV, HSV, syphilis, other HIV rash.  Not concerning for Kaposi's sarcoma.  Patient's presentation is most consistent with acute complicated illness / injury requiring diagnostic workup.  Patient is a 53 year old male presenting today for rash around his buttocks.  On exam it appears most consistent with HPV which she is unsure of any history of.  No significant surrounding erythema to suggest cellulitis.  No other abdominal symptoms, constipation, diarrhea.  Discussed the case with infectious disease (Dr. Francee Inch) who believes that is almost certainly HPV and recommends imiquimod cream nightly for the next 2 weeks.  Follow-up with his  infectious disease doctor as he may be eligible for HPV vaccine which would help.  He can also follow-up with dermatology for potential cryotherapy if needed.  Will also need anal Pap smear for further evaluation of HPV.  Labs were sent off for CD4 count, viral load, and syphilis.  Infectious disease doctor stated that she would follow-up with these.  Clinical Course as of 06/05/23 1323  Tue Jun 05, 2023  1311 ID, Dr. Francee Inch - almost certainly HPV. Would be eligible for HPV vaccine from his ID team. Follow up with dermatology. Will need anal pap smear.  Try imiquimod.  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   Kandee Orion, MD 06/05/23 1323

## 2023-06-05 NOTE — Discharge Instructions (Addendum)
 The rash on your bottom is most likely from HPV which is human papilloma virus.  I have sent a cream to your pharmacy which you will apply nightly for the next 2 weeks.  You will also need to follow-up with your infectious disease doctor to discuss whether getting an HPV vaccine will be helpful.  They may also need to do an anal Pap smear to make sure there is no signs of HPV inside the rectum.  I have also sent a referral for you to follow-up with dermatology because if the cream is not helping, they may potentially be able to do cryotherapy to remove the lesions.  We have sent off any additional testing that if it is positive or abnormal, someone will call you.

## 2023-06-05 NOTE — ED Triage Notes (Addendum)
 Pt states that he has had a rash to bilat buttocks for the past month, states that it isn't going away or getting better  Pt states that he is HIV + and was started a new medication a month ago and states that this is when all this started

## 2023-06-06 ENCOUNTER — Telehealth: Payer: Self-pay | Admitting: Emergency Medicine

## 2023-06-06 ENCOUNTER — Telehealth: Payer: Self-pay | Admitting: Infectious Diseases

## 2023-06-06 LAB — HELPER T-LYMPH-CD4 (ARMC ONLY)
% CD 4 Pos. Lymph.: 19.9 % — ABNORMAL LOW (ref 30.8–58.5)
Absolute CD 4 Helper: 378 /uL (ref 359–1519)
Basophils Absolute: 0 10*3/uL (ref 0.0–0.2)
Basos: 1 %
EOS (ABSOLUTE): 0.2 10*3/uL (ref 0.0–0.4)
Eos: 3 %
Hematocrit: 43.5 % (ref 37.5–51.0)
Hemoglobin: 13.2 g/dL (ref 13.0–17.7)
Immature Grans (Abs): 0 10*3/uL (ref 0.0–0.1)
Immature Granulocytes: 1 %
Lymphocytes Absolute: 1.9 10*3/uL (ref 0.7–3.1)
Lymphs: 32 %
MCH: 22.6 pg — ABNORMAL LOW (ref 26.6–33.0)
MCHC: 30.3 g/dL — ABNORMAL LOW (ref 31.5–35.7)
MCV: 74 fL — ABNORMAL LOW (ref 79–97)
Monocytes Absolute: 0.4 10*3/uL (ref 0.1–0.9)
Monocytes: 6 %
Neutrophils Absolute: 3.4 10*3/uL (ref 1.4–7.0)
Neutrophils: 57 %
Platelets: 313 10*3/uL (ref 150–450)
RBC: 5.85 x10E6/uL — ABNORMAL HIGH (ref 4.14–5.80)
RDW: 17.5 % — ABNORMAL HIGH (ref 11.6–15.4)
WBC: 5.8 10*3/uL (ref 3.4–10.8)

## 2023-06-06 LAB — RPR
RPR Ser Ql: REACTIVE — AB
RPR Titer: 1:64 {titer}

## 2023-06-06 NOTE — Telephone Encounter (Signed)
 Yesterday I got a call From Dr.Wells regarding this patient asking me to view his Picture on Epic regarding a buttock rash0- He wanted to know whether it was Herpes- The D.D on this lesion was HPV /. Syphilis condyloma lata ???? Sent RPR and it is come back as 1:64-  Not able to reach patient on the phone  Just left a generic message . His last rpr was in jan 2024 and was negative. HE will need 3 doses and also will need to r/o HPV warts

## 2023-06-06 NOTE — Telephone Encounter (Signed)
 Patient seen in the ED yesterday for rash on his buttocks region.  A syphilis test was sent out and came back positive today with an elevated titer.  The infectious disease doctor attempted to call with no answer.  I was able to get a hold of him on the phone and discussed with him that he will need 3 doses of benzathine penicillin for 3 weeks for treatment.  He stated he would call his infectious disease team, [name], to get this set up and completed.  I stated if he had any difficulties he could also follow-up with the health department or come back to the ED.  Patient was agreeable with plan.

## 2023-06-07 LAB — HIV-1/2 AB - DIFFERENTIATION
HIV 1 Ab: REACTIVE
HIV 2 Ab: NONREACTIVE

## 2023-06-07 LAB — T.PALLIDUM AB, TOTAL: T Pallidum Abs: REACTIVE — AB

## 2023-06-08 ENCOUNTER — Telehealth: Payer: Self-pay

## 2023-06-08 NOTE — Telephone Encounter (Signed)
 [facility] received CD report from [facility] re + syphilis result: 06/05/23 RPR Reactive 1:64 TP= Reactive  See 06/06/23 phone note: Informed of result by [facility]. Pt plans to f/u with his ID team, [name]. -------------------  Confirm F/U appt with ID is scheduled and pt being followed.

## 2023-06-13 NOTE — Telephone Encounter (Signed)
 Spoke with Tiera Stackhouse, DIS.  Found No history of syphilis in past. Darlina Einstein will be calling [employer] re care with pt's ID doctor.

## 2023-06-18 NOTE — Telephone Encounter (Addendum)
 Spoke with Victor Scott, DIS. Pt went to ID dr, [name] MD. Pt is scheduled for bicillin x 3. Received Bicillin dose # 1 on 06/08/2023.
